# Patient Record
Sex: Male | Born: 1990
Health system: Southern US, Community
[De-identification: ages and names within clinical notes are randomized; demographics above are authoritative.]

## PROBLEM LIST (undated history)

## (undated) DIAGNOSIS — K219 Gastro-esophageal reflux disease without esophagitis: Secondary | ICD-10-CM

## (undated) DIAGNOSIS — G43909 Migraine, unspecified, not intractable, without status migrainosus: Secondary | ICD-10-CM

## (undated) DIAGNOSIS — I471 Supraventricular tachycardia, unspecified: Secondary | ICD-10-CM

## (undated) DIAGNOSIS — I10 Essential (primary) hypertension: Secondary | ICD-10-CM

## (undated) DIAGNOSIS — F419 Anxiety disorder, unspecified: Secondary | ICD-10-CM

## (undated) DIAGNOSIS — R000 Tachycardia, unspecified: Secondary | ICD-10-CM

## (undated) DIAGNOSIS — K449 Diaphragmatic hernia without obstruction or gangrene: Secondary | ICD-10-CM

## (undated) DIAGNOSIS — H43399 Other vitreous opacities, unspecified eye: Secondary | ICD-10-CM

## (undated) HISTORY — PX: WISDOM TOOTH EXTRACTION: SHX21

---
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
## 2012-11-20 ENCOUNTER — Encounter (HOSPITAL_COMMUNITY): Payer: Self-pay | Admitting: *Deleted

## 2012-11-20 ENCOUNTER — Emergency Department (HOSPITAL_COMMUNITY)
Admission: EM | Admit: 2012-11-20 | Discharge: 2012-11-20 | Disposition: A | Discharge status: Home / Self Care | Payer: Self-pay | Attending: Emergency Medicine | Admitting: Emergency Medicine

## 2012-11-20 DIAGNOSIS — Z8679 Personal history of other diseases of the circulatory system: Secondary | ICD-10-CM | POA: Insufficient documentation

## 2012-11-20 DIAGNOSIS — H60399 Other infective otitis externa, unspecified ear: Secondary | ICD-10-CM | POA: Insufficient documentation

## 2012-11-20 DIAGNOSIS — F17200 Nicotine dependence, unspecified, uncomplicated: Secondary | ICD-10-CM | POA: Insufficient documentation

## 2012-11-20 DIAGNOSIS — H6091 Unspecified otitis externa, right ear: Secondary | ICD-10-CM

## 2012-11-20 DIAGNOSIS — J3489 Other specified disorders of nose and nasal sinuses: Secondary | ICD-10-CM | POA: Insufficient documentation

## 2012-11-20 DIAGNOSIS — R197 Diarrhea, unspecified: Secondary | ICD-10-CM

## 2012-11-20 LAB — BASIC METABOLIC PANEL
BUN: 10 mg/dL (ref 6–23)
CO2: 27 mEq/L (ref 19–32)
Calcium: 10.2 mg/dL (ref 8.4–10.5)
Chloride: 102 mEq/L (ref 96–112)
Creatinine, Ser: 0.88 mg/dL (ref 0.50–1.35)
GFR calc Af Amer: >90 mL/min (ref >90)
GFR calc non Af Amer: >90 mL/min (ref >90)
Glucose, Bld: 91 mg/dL (ref 70–99)
Potassium: 4.4 mEq/L (ref 3.5–5.1)
Sodium: 140 mEq/L (ref 135–145)

## 2012-11-20 MED ORDER — DIPHENOXYLATE-ATROPINE 2.5-0.025 MG PO TABS
2.0000 | ORAL_TABLET | Freq: Once | ORAL | Status: AC
  Administered 2012-11-20: 2 via ORAL
  Filled 2012-11-20: qty 2

## 2012-11-20 MED ORDER — DIPHENOXYLATE-ATROPINE 2.5-0.025 MG PO TABS: 1.0000 | ORAL_TABLET | Freq: Four times a day (QID) | ORAL | Status: DC | PRN

## 2012-11-20 MED ORDER — ANTIPYRINE-BENZOCAINE 5.4-1.4 % OT SOLN
3.0000 [drp] | Freq: Once | OTIC | Status: AC
  Administered 2012-11-20: 3 [drp] via OTIC
  Filled 2012-11-20: qty 10

## 2012-11-20 MED ORDER — CIPROFLOXACIN-DEXAMETHASONE 0.3-0.1 % OT SUSP
3.0000 [drp] | Freq: Two times a day (BID) | OTIC | Status: DC
  Administered 2012-11-20: 3 [drp] via OTIC
  Filled 2012-11-20: qty 7.5

## 2012-11-20 MED ORDER — CIPROFLOXACIN-HYDROCORTISONE 0.2-1 % OT SUSP: 3.0000 [drp] | Freq: Two times a day (BID) | OTIC | Status: DC

## 2012-11-20 NOTE — ED Notes (Signed)
Nasal congestion,  Rt ear pain, diarrhea for 2 days,

## 2012-11-20 NOTE — ED Provider Notes (Signed)
History     CSN: [id number]  Arrival date & time 11/20/12  1120   First MD Initiated Contact with Patient 11/20/12 1139      Chief Complaint  Patient presents with  . Otalgia    (Consider location/radiation/quality/duration/timing/severity/associated sxs/prior treatment) HPI Comments: Samuel Blackwell is a 22 y.o. Male with 2 complaints today, the first being diarrhea which started 2 days ago.  He reports nonbloody brown watery stool preceded by intermittent abdominal cramping,  And had 7-8 episodes yesterday,  3 this am before arrival.  He denies abdominal pain,  Fever, nausea and vomiting.  He has had decreased PO intake as eating triggers the episodes.  No other household members have similar symptoms.  The second complaint is right ear ache which also started 2 days ago.  He has had mild nasal congestion without drainage.  He denies decreased hearing acuity or drainage from this ear, has had no headaches,  No dental concerns.  He has taken no medicines for his symptoms.       The history is provided by the patient.    Past Medical History  Diagnosis Date  . Migraines     History reviewed. No pertinent past surgical history.  History reviewed. No pertinent family history.  History  Substance Use Topics  . Smoking status: Current Every Day Smoker -- 0.25 packs/day    Types: Cigarettes  . Smokeless tobacco: Not on file  . Alcohol Use: No      Review of Systems  Constitutional: Negative for fever.  HENT: Positive for ear pain and congestion. Negative for hearing loss, sore throat, rhinorrhea, neck pain, tinnitus and ear discharge.   Eyes: Negative.   Respiratory: Negative for chest tightness and shortness of breath.   Cardiovascular: Negative for chest pain.  Gastrointestinal: Positive for diarrhea. Negative for nausea, vomiting and abdominal pain.  Genitourinary: Negative.   Musculoskeletal: Negative for joint swelling and arthralgias.  Skin: Negative.  Negative for  rash and wound.  Neurological: Negative for dizziness, weakness, light-headedness, numbness and headaches.  Psychiatric/Behavioral: Negative.     Allergies  Review of patient's allergies indicates no known allergies.  Home Medications   Current Outpatient Rx  Name  Route  Sig  Dispense  Refill  . diphenoxylate-atropine (LOMOTIL) 2.5-0.025 MG per tablet   Oral   Take 1 tablet by mouth 4 (four) times daily as needed for diarrhea or loose stools.   15 tablet   0     BP 123/82  Pulse 87  Temp(Src) 98.7 F (37.1 C)  Resp 20  Ht 5\' 8"  (1.727 m)  Wt 245 lb (111.131 kg)  BMI 37.26 kg/m2  SpO2 99%  Physical Exam  Nursing note and vitals reviewed. Constitutional: He appears well-developed and well-nourished.  HENT:  Head: Normocephalic and atraumatic.  Right Ear: Tympanic membrane normal. No drainage or swelling. Tympanic membrane is not injected.  Left Ear: External ear normal.  Right external canal has erythema without edema.  No drainage.  Pain with movement of external ear.  Eyes: Conjunctivae are normal.  Neck: Normal range of motion.  Cardiovascular: Normal rate, regular rhythm, normal heart sounds and intact distal pulses.   Pulmonary/Chest: Effort normal and breath sounds normal. He has no wheezes.  Abdominal: Soft. Bowel sounds are normal. There is no tenderness.  Musculoskeletal: Normal range of motion.  Neurological: He is alert.  Skin: Skin is warm and dry.  Psychiatric: He has a normal mood and affect.    ED Course  Procedures (including critical care time)  Labs Reviewed  BASIC METABOLIC PANEL   No results found.   1. External otitis of right ear   2. Diarrhea       MDM  Patients labs and/or radiological studies were reviewed during the medical decision making and disposition process. Pt was given dose of lomotil,  Prescription for more prn.  Also given ciprodex, auralgan.  Planned f/u with pcp if sx persist.        Burgess Amor,  PA 11/20/12 1413

## 2012-11-22 NOTE — ED Provider Notes (Signed)
Medical screening examination/treatment/procedure(s) were performed by non-physician practitioner and as supervising physician I was immediately available for consultation/collaboration.    Shaquile Lutze D Thao Bauza, MD 11/22/12 0328 

## 2012-12-19 ENCOUNTER — Emergency Department (HOSPITAL_COMMUNITY)
Admission: EM | Admit: 2012-12-19 | Discharge: 2012-12-19 | Disposition: A | Discharge status: Home / Self Care | Payer: Self-pay | Attending: Emergency Medicine | Admitting: Emergency Medicine

## 2012-12-19 ENCOUNTER — Encounter (HOSPITAL_COMMUNITY): Payer: Self-pay | Admitting: *Deleted

## 2012-12-19 DIAGNOSIS — F17200 Nicotine dependence, unspecified, uncomplicated: Secondary | ICD-10-CM | POA: Insufficient documentation

## 2012-12-19 DIAGNOSIS — R112 Nausea with vomiting, unspecified: Secondary | ICD-10-CM | POA: Insufficient documentation

## 2012-12-19 DIAGNOSIS — G43909 Migraine, unspecified, not intractable, without status migrainosus: Secondary | ICD-10-CM | POA: Insufficient documentation

## 2012-12-19 MED ORDER — DEXAMETHASONE 6 MG PO TABS: ORAL_TABLET | ORAL | Status: DC

## 2012-12-19 MED ORDER — HYDROCODONE-ACETAMINOPHEN 7.5-325 MG PO TABS
1.0000 | ORAL_TABLET | ORAL | Status: DC | PRN
Start: 2012-12-19 — End: 2014-09-29

## 2012-12-19 MED ORDER — PROMETHAZINE HCL 25 MG PO TABS: 25.0000 mg | ORAL_TABLET | Freq: Four times a day (QID) | ORAL | Status: DC | PRN

## 2012-12-19 NOTE — ED Provider Notes (Signed)
History     CSN: [id number]  Arrival date & time 12/19/12  1306   First MD Initiated Contact with Patient 12/19/12 1352      Chief Complaint  Patient presents with  . Migraine    (Consider location/radiation/quality/duration/timing/severity/associated sxs/prior treatment) HPI Comments: Pt c/o frontal headache since 5AM. 1 episode of vomiting this AM, but none now.  Patient is a 22 y.o. male presenting with migraines. The history is provided by the patient.  Migraine This is a recurrent problem. The current episode started today. The problem occurs constantly. The problem has been unchanged. Associated symptoms include headaches and nausea. Pertinent negatives include no abdominal pain, arthralgias, chest pain, coughing, fever, neck pain or sore throat. Exacerbated by: lights and noises. He has tried NSAIDs for the symptoms. The treatment provided no relief.    Past Medical History  Diagnosis Date  . Migraines     History reviewed. No pertinent past surgical history.  No family history on file.  History  Substance Use Topics  . Smoking status: Current Every Day Smoker -- 0.25 packs/day    Types: Cigarettes  . Smokeless tobacco: Not on file  . Alcohol Use: No      Review of Systems  Constitutional: Negative for fever and activity change.       All ROS Neg except as noted in HPI  HENT: Negative for nosebleeds, sore throat and neck pain.   Eyes: Negative for photophobia and discharge.  Respiratory: Negative for cough, shortness of breath and wheezing.   Cardiovascular: Negative for chest pain and palpitations.  Gastrointestinal: Positive for nausea. Negative for abdominal pain and blood in stool.  Genitourinary: Negative for dysuria, frequency and hematuria.  Musculoskeletal: Negative for back pain and arthralgias.  Skin: Negative.   Neurological: Positive for headaches. Negative for dizziness, seizures and speech difficulty.  Psychiatric/Behavioral: Negative for  hallucinations and confusion.    Allergies  Review of patient's allergies indicates no known allergies.  Home Medications   Current Outpatient Rx  Name  Route  Sig  Dispense  Refill  . diphenoxylate-atropine (LOMOTIL) 2.5-0.025 MG per tablet   Oral   Take 1 tablet by mouth 4 (four) times daily as needed for diarrhea or loose stools.   15 tablet   0     BP 147/74  Pulse 64  Temp(Src) 98.4 F (36.9 C) (Oral)  Resp 20  SpO2 99%  Physical Exam  Nursing note and vitals reviewed. Constitutional: He is oriented to person, place, and time. He appears well-developed and well-nourished.  Non-toxic appearance.  HENT:  Head: Normocephalic.  Right Ear: Tympanic membrane and external ear normal.  Left Ear: Tympanic membrane and external ear normal.  Eyes: EOM and lids are normal. Pupils are equal, round, and reactive to light.  Neck: Normal range of motion. Neck supple. Carotid bruit is not present.  Cardiovascular: Normal rate, regular rhythm, normal heart sounds, intact distal pulses and normal pulses.   Pulmonary/Chest: Breath sounds normal. No respiratory distress.  Abdominal: Soft. Bowel sounds are normal. There is no tenderness. There is no guarding.  Musculoskeletal: Normal range of motion.  Lymphadenopathy:       Head (right side): No submandibular adenopathy present.       Head (left side): No submandibular adenopathy present.    He has no cervical adenopathy.  Neurological: He is alert and oriented to person, place, and time. He has normal strength. No cranial nerve deficit or sensory deficit. He exhibits normal muscle tone. Coordination normal.  Skin: Skin is warm and dry.  Psychiatric: He has a normal mood and affect. His speech is normal.    ED Course  Procedures (including critical care time)  Labs Reviewed - No data to display No results found.   No diagnosis found.    MDM  I have reviewed nursing notes, vital signs, and all appropriate lab and imaging  results for this patient. Patient has a history of migraine headaches. This headache is similar to previous headaches. No gross neurologic deficits appreciated on examination. Patient will be treated with Decadron 6 mg daily, Norco 7.5 mg every 4 hours, and promethazine 25 mg every 6 hours. Vision is advised to return to the emergency department immediately if any changes, problems, or concerns.       Kathie Dike, PA-C 12/20/12 [phone]

## 2012-12-19 NOTE — ED Notes (Addendum)
Pt c/o migraine headache that started this am, did experience one episode of n/v, denies any nausea feeling at present, is bothered by light and sounds,. Has hx of migraines since he was 22 years old. Pt states that this migraine is the same as migraines in the past.

## 2012-12-20 NOTE — ED Provider Notes (Signed)
Medical screening examination/treatment/procedure(s) were performed by non-physician practitioner and as supervising physician I was immediately available for consultation/collaboration.  Donnetta Hutching, MD 12/20/12 [phone]

## 2013-02-20 ENCOUNTER — Encounter (HOSPITAL_COMMUNITY): Payer: Self-pay | Admitting: *Deleted

## 2013-02-20 ENCOUNTER — Emergency Department (HOSPITAL_COMMUNITY)
Admission: EM | Admit: 2013-02-20 | Discharge: 2013-02-20 | Disposition: A | Discharge status: Home / Self Care | Payer: Self-pay | Attending: Emergency Medicine | Admitting: Emergency Medicine

## 2013-02-20 DIAGNOSIS — R21 Rash and other nonspecific skin eruption: Secondary | ICD-10-CM | POA: Insufficient documentation

## 2013-02-20 DIAGNOSIS — K089 Disorder of teeth and supporting structures, unspecified: Secondary | ICD-10-CM | POA: Insufficient documentation

## 2013-02-20 DIAGNOSIS — G8929 Other chronic pain: Secondary | ICD-10-CM | POA: Insufficient documentation

## 2013-02-20 DIAGNOSIS — L089 Local infection of the skin and subcutaneous tissue, unspecified: Secondary | ICD-10-CM | POA: Insufficient documentation

## 2013-02-20 DIAGNOSIS — G43909 Migraine, unspecified, not intractable, without status migrainosus: Secondary | ICD-10-CM | POA: Insufficient documentation

## 2013-02-20 DIAGNOSIS — F17200 Nicotine dependence, unspecified, uncomplicated: Secondary | ICD-10-CM | POA: Insufficient documentation

## 2013-02-20 DIAGNOSIS — R509 Fever, unspecified: Secondary | ICD-10-CM | POA: Insufficient documentation

## 2013-02-20 MED ORDER — CIPROFLOXACIN HCL 500 MG PO TABS: 500.0000 mg | ORAL_TABLET | Freq: Two times a day (BID) | ORAL | Status: DC

## 2013-02-20 MED ORDER — HYDROCODONE-ACETAMINOPHEN 5-325 MG PO TABS: 1.0000 | ORAL_TABLET | ORAL | Status: DC | PRN

## 2013-02-20 MED ORDER — SULFAMETHOXAZOLE-TRIMETHOPRIM 800-160 MG PO TABS: 1.0000 | ORAL_TABLET | Freq: Two times a day (BID) | ORAL | Status: DC

## 2013-02-20 NOTE — ED Notes (Signed)
Pt c/o right side dental pain for the past two years, pain became worse since march, c/o pain to bilateral axilla area that started two days ago,

## 2013-02-20 NOTE — ED Notes (Signed)
Pt c/o left side dental pain. Pt also presents with red abscess-like areas under both arms. Pt states he first noticed areas 2 days ago.

## 2013-02-20 NOTE — ED Provider Notes (Signed)
Medical screening examination/treatment/procedure(s) were performed by non-physician practitioner and as supervising physician I was immediately available for consultation/collaboration.   Linell Meldrum, MD 02/20/13 2342 

## 2013-02-20 NOTE — ED Provider Notes (Signed)
History     CSN: [id number]  Arrival date & time 02/20/13  1610   First MD Initiated Contact with Patient 02/20/13 1629      Chief Complaint  Patient presents with  . Arm Pain  . Dental Pain    (Consider location/radiation/quality/duration/timing/severity/associated sxs/prior treatment) Patient is a 22 y.o. male presenting with tooth pain and rash. The history is provided by the patient.  Dental Pain Location:  Lower Lower teeth location:  32/RL 3rd molar Quality:  Throbbing Severity:  Moderate Onset quality:  Gradual Duration:  2 months Timing:  Intermittent Progression:  Worsening Chronicity:  Recurrent Relieved by:  Nothing Worsened by:  Pressure Ineffective treatments:  None tried Associated symptoms: no neck pain  Fever: low grade.   Rash Pain location: bilateral axilla. Pain quality: burning and sharp   Pain radiates to:  Does not radiate Pain severity:  Moderate Onset quality:  Gradual Duration:  2 days Timing:  Constant Progression:  Worsening Chronicity:  New Context comment:  Hot tub Relieved by:  Nothing Worsened by:  Palpation Ineffective treatments:  None tried Associated symptoms: no anorexia, no cough, no diarrhea, no nausea, no shortness of breath, no sore throat and no vomiting  Fever: low grade.    Samuel Blackwell is a 22 y.o. male who presents to the ED with dental pain and rash. The dental pain has been off and on for two years and this episode got worse over the past 2 months. The pain is located in the right lower wisdom tooth that is trying to come through. The rash is located under both arms and started 2 days ago after being in a hot tub. The areas are painful, red swollen.   Past Medical History  Diagnosis Date  . Migraines     History reviewed. No pertinent past surgical history.  No family history on file.  History  Substance Use Topics  . Smoking status: Current Every Day Smoker -- 0.25 packs/day    Types: Cigarettes  .  Smokeless tobacco: Not on file  . Alcohol Use: No      Review of Systems  Constitutional: Negative for activity change. Fever: low grade.  HENT: Positive for dental problem. Negative for ear pain, sore throat and neck pain.   Eyes: Negative for pain.  Respiratory: Negative for cough and shortness of breath.   Gastrointestinal: Negative for nausea, vomiting, abdominal pain, diarrhea and anorexia.  Musculoskeletal:       Rash under both arms and a few areas on upper back.  Skin: Positive for rash.  Allergic/Immunologic: Negative for immunocompromised state.  Neurological: Negative for dizziness and syncope.  Psychiatric/Behavioral: The patient is not nervous/anxious.     Allergies  Review of patient's allergies indicates no known allergies.  Home Medications   Current Outpatient Rx  Name  Route  Sig  Dispense  Refill  . dexamethasone (DECADRON) 6 MG tablet      1 po daily   6 tablet   0   . HYDROcodone-acetaminophen (NORCO) 7.5-325 MG per tablet   Oral   Take 1 tablet by mouth every 4 (four) hours as needed for pain.   15 tablet   0   . EXPIRED: promethazine (PHENERGAN) 25 MG tablet   Oral   Take 1 tablet (25 mg total) by mouth every 6 (six) hours as needed for nausea.   6 tablet   0   . promethazine (PHENERGAN) 25 MG tablet   Oral   Take 1  tablet (25 mg total) by mouth every 6 (six) hours as needed for nausea.   12 tablet   0     BP 155/96  Pulse 88  Temp(Src) 101.2 F (38.4 C) (Oral)  Resp 20  Ht 5\' 8"  (1.727 m)  Wt 245 lb (111.131 kg)  BMI 37.26 kg/m2  SpO2 100%  Temp rechecked at 16:57 and 98.1, he has not had anything cold to drink and has not had any treatment for fever. His initial temperature was taking when he came in from outside after smoking.   Physical Exam  Nursing note and vitals reviewed. Constitutional: He is oriented to person, place, and time. He appears well-developed and well-nourished. No distress.  HENT:  Head: Normocephalic.   Right Ear: Tympanic membrane normal.  Left Ear: Tympanic membrane normal.  Nose: Nose normal.  Mouth/Throat: Uvula is midline, oropharynx is clear and moist and mucous membranes are normal.  Right third molar erupting and tender with palpation.  Eyes: EOM are normal.  Neck: Normal range of motion. Neck supple.  Cardiovascular: Normal rate.   Pulmonary/Chest: Effort normal and breath sounds normal.  Musculoskeletal: Normal range of motion. He exhibits no edema.  Neurological: He is alert and oriented to person, place, and time. No cranial nerve deficit.  Skin: Skin is warm and dry. Rash noted. Rash is papular.  Lesions noted bilateral axilla with erythema and some areas with small pustular areas. Few areas noted on upper back as well.   Psychiatric: He has a normal mood and affect. His behavior is normal.    ED Course  Procedures (including critical care time)  MDM  22 y.o. male with dental pain due to eruption of right lower third molar. Rash bilateral axilla and upper back with infection.  Since patient has history of being in a hot tub prior to the skin infection I will treat him with Cipro in the event the infection my be caused by Pseudomonas. I will also treat with Bactrim DS for possible staph. I discussed with the patent the importance of follow up and becoming established with a PCP for his general health. Patient voices understanding. Patient stable for discharge home without any immediate complications.  Discussed findings and plan of care with the patient .All questioned fully answered. He will return if any problems arise.     Medication List    TAKE these medications       ciprofloxacin 500 MG tablet  Commonly known as:  CIPRO  Take 1 tablet (500 mg total) by mouth 2 (two) times daily.     HYDROcodone-acetaminophen 5-325 MG per tablet  Commonly known as:  NORCO/VICODIN  Take 1 tablet by mouth every 4 (four) hours as needed.     sulfamethoxazole-trimethoprim 800-160  MG per tablet  Commonly known as:  SEPTRA DS  Take 1 tablet by mouth every 12 (twelve) hours.      ASK your doctor about these medications       dexamethasone 6 MG tablet  Commonly known as:  DECADRON  1 po daily     HYDROcodone-acetaminophen 7.5-325 MG per tablet  Commonly known as:  NORCO  Take 1 tablet by mouth every 4 (four) hours as needed for pain.     promethazine 25 MG tablet  Commonly known as:  PHENERGAN  Take 1 tablet (25 mg total) by mouth every 6 (six) hours as needed for nausea.     promethazine 25 MG tablet  Commonly known as:  PHENERGAN  Take  1 tablet (25 mg total) by mouth every 6 (six) hours as needed for nausea.              [facility] Orlene Och, [place] 02/20/13 [phone]

## 2013-11-27 ENCOUNTER — Emergency Department (HOSPITAL_COMMUNITY): Payer: Self-pay

## 2013-11-27 ENCOUNTER — Emergency Department (HOSPITAL_COMMUNITY)
Admission: EM | Admit: 2013-11-27 | Discharge: 2013-11-27 | Disposition: A | Discharge status: Home / Self Care | Payer: Self-pay | Attending: Emergency Medicine | Admitting: Emergency Medicine

## 2013-11-27 ENCOUNTER — Encounter (HOSPITAL_COMMUNITY): Payer: Self-pay | Admitting: Emergency Medicine

## 2013-11-27 DIAGNOSIS — N200 Calculus of kidney: Secondary | ICD-10-CM | POA: Insufficient documentation

## 2013-11-27 DIAGNOSIS — Z8679 Personal history of other diseases of the circulatory system: Secondary | ICD-10-CM | POA: Insufficient documentation

## 2013-11-27 DIAGNOSIS — Z87891 Personal history of nicotine dependence: Secondary | ICD-10-CM | POA: Insufficient documentation

## 2013-11-27 LAB — URINALYSIS, ROUTINE W REFLEX MICROSCOPIC
Bilirubin Urine: NEGATIVE
GLUCOSE, UA: NEGATIVE mg/dL
KETONES UR: NEGATIVE mg/dL
LEUKOCYTES UA: NEGATIVE
Nitrite: NEGATIVE
PH: 5.5 (ref 5.0–8.0)
Urobilinogen, UA: 0.2 mg/dL (ref 0.0–1.0)

## 2013-11-27 LAB — BASIC METABOLIC PANEL
BUN: 13 mg/dL (ref 6–23)
CHLORIDE: 101 meq/L (ref 96–112)
CO2: 25 meq/L (ref 19–32)
CREATININE: 1.11 mg/dL (ref 0.50–1.35)
Calcium: 9.3 mg/dL (ref 8.4–10.5)
GFR calc non Af Amer: >90 mL/min (ref >90)
Glucose, Bld: 117 mg/dL — ABNORMAL HIGH (ref 70–99)
POTASSIUM: 4.1 meq/L (ref 3.7–5.3)
Sodium: 139 mEq/L (ref 137–147)

## 2013-11-27 LAB — URINE MICROSCOPIC-ADD ON

## 2013-11-27 MED ORDER — HYDROMORPHONE HCL PF 1 MG/ML IJ SOLN
1.0000 mg | Freq: Once | INTRAMUSCULAR | Status: AC
  Administered 2013-11-27: 1 mg via INTRAVENOUS
  Filled 2013-11-27: qty 1

## 2013-11-27 MED ORDER — PROMETHAZINE HCL 25 MG PO TABS: 25.0000 mg | ORAL_TABLET | Freq: Four times a day (QID) | ORAL | Status: DC | PRN

## 2013-11-27 MED ORDER — OXYCODONE-ACETAMINOPHEN 5-325 MG PO TABS
2.0000 | ORAL_TABLET | Freq: Once | ORAL | Status: AC
  Administered 2013-11-27: 2 via ORAL
  Filled 2013-11-27: qty 2

## 2013-11-27 MED ORDER — HYDROCODONE-ACETAMINOPHEN 5-325 MG PO TABS: 2.0000 | ORAL_TABLET | ORAL | Status: DC | PRN

## 2013-11-27 MED ORDER — ONDANSETRON HCL 4 MG/2ML IJ SOLN
4.0000 mg | Freq: Once | INTRAMUSCULAR | Status: AC
  Administered 2013-11-27: 4 mg via INTRAVENOUS
  Filled 2013-11-27: qty 2

## 2013-11-27 MED ORDER — KETOROLAC TROMETHAMINE 30 MG/ML IJ SOLN
30.0000 mg | Freq: Once | INTRAMUSCULAR | Status: AC
  Administered 2013-11-27: 30 mg via INTRAVENOUS
  Filled 2013-11-27: qty 1

## 2013-11-27 MED ORDER — NAPROXEN 500 MG PO TABS: 500.0000 mg | ORAL_TABLET | Freq: Two times a day (BID) | ORAL | Status: DC

## 2013-11-27 NOTE — ED Provider Notes (Signed)
CSN: [id number]     Arrival date & time 11/27/13  0123 History   First MD Initiated Contact with Patient 11/27/13 0149     Chief Complaint  Patient presents with  . Flank Pain     (Consider location/radiation/quality/duration/timing/severity/associated sxs/prior Treatment) HPI Comments: 23 year old male, presents with a complaint of abdominal pain which is located in the right side and mid abdomen, started when he awoke at 9:00 in the morning yesterday, has been intermittent throughout the last 18 hours but became severe this evening causing associated vomiting. He denies any change in the color of his urine, this pain does not radiate to his flank, it does not radiate to his groin and he has no testicular pain or masses. He denies anything that makes this better or worse, he states that it is spontaneous and there is no position of comfort. He has no history of kidney stones and has never had abdominal surgery.  Patient is a 23 y.o. male presenting with flank pain. The history is provided by the patient.  Flank Pain    Past Medical History  Diagnosis Date  . Migraines    History reviewed. No pertinent past surgical history. No family history on file. History  Substance Use Topics  . Smoking status: Former Smoker -- 0.25 packs/day    Types: Cigarettes  . Smokeless tobacco: Not on file  . Alcohol Use: Yes    Review of Systems  Genitourinary: Positive for flank pain.  All other systems reviewed and are negative.      Allergies  Review of patient's allergies indicates no known allergies.  Home Medications   Current Outpatient Rx  Name  Route  Sig  Dispense  Refill  . ciprofloxacin (CIPRO) 500 MG tablet   Oral   Take 1 tablet (500 mg total) by mouth 2 (two) times daily.   14 tablet   0   . dexamethasone (DECADRON) 6 MG tablet      1 po daily   6 tablet   0   . HYDROcodone-acetaminophen (NORCO) 7.5-325 MG per tablet   Oral   Take 1 tablet by mouth every 4 (four)  hours as needed for pain.   15 tablet   0   . HYDROcodone-acetaminophen (NORCO/VICODIN) 5-325 MG per tablet   Oral   Take 1 tablet by mouth every 4 (four) hours as needed.   15 tablet   0   . HYDROcodone-acetaminophen (NORCO/VICODIN) 5-325 MG per tablet   Oral   Take 2 tablets by mouth every 4 (four) hours as needed.   10 tablet   0   . naproxen (NAPROSYN) 500 MG tablet   Oral   Take 1 tablet (500 mg total) by mouth 2 (two) times daily with a meal.   30 tablet   0   . EXPIRED: promethazine (PHENERGAN) 25 MG tablet   Oral   Take 1 tablet (25 mg total) by mouth every 6 (six) hours as needed for nausea.   6 tablet   0   . promethazine (PHENERGAN) 25 MG tablet   Oral   Take 1 tablet (25 mg total) by mouth every 6 (six) hours as needed for nausea.   12 tablet   0   . promethazine (PHENERGAN) 25 MG tablet   Oral   Take 1 tablet (25 mg total) by mouth every 6 (six) hours as needed for nausea or vomiting.   12 tablet   0   . sulfamethoxazole-trimethoprim (SEPTRA DS) 800-160 MG  per tablet   Oral   Take 1 tablet by mouth every 12 (twelve) hours.   14 tablet   0    BP 147/77  Pulse 108  Temp(Src) 98.1 F (36.7 C) (Oral)  Resp 22  Ht 5\' 9"  (1.753 m)  Wt 240 lb (108.863 kg)  BMI 35.43 kg/m2  SpO2 96% Physical Exam  Nursing note and vitals reviewed. Constitutional: He appears well-developed and well-nourished. He appears distressed.  HENT:  Head: Normocephalic and atraumatic.  Mouth/Throat: Oropharynx is clear and moist. No oropharyngeal exudate.  Eyes: Conjunctivae and EOM are normal. Pupils are equal, round, and reactive to light. Right eye exhibits no discharge. Left eye exhibits no discharge. No scleral icterus.  Neck: Normal range of motion. Neck supple. No JVD present. No thyromegaly present.  Cardiovascular: Normal rate, regular rhythm, normal heart sounds and intact distal pulses.  Exam reveals no gallop and no friction rub.   No murmur  heard. Pulmonary/Chest: Effort normal and breath sounds normal. No respiratory distress. He has no wheezes. He has no rales.  Abdominal: Soft. Bowel sounds are normal. He exhibits no distension and no mass. There is no tenderness.  Soft nontender abdomen with no CVA tenderness  Genitourinary:  Normal appearing testicles, scrotum, penis and no inguinal hernias present  Musculoskeletal: Normal range of motion. He exhibits no edema and no tenderness.  Lymphadenopathy:    He has no cervical adenopathy.  Neurological: He is alert. Coordination normal.  Skin: Skin is warm and dry. No rash noted. No erythema.  Psychiatric: He has a normal mood and affect. His behavior is normal.    ED Course  Procedures (including critical care time) Labs Review Labs Reviewed  URINALYSIS, ROUTINE W REFLEX MICROSCOPIC - Abnormal; Notable for the following:    Specific Gravity, Urine >1.030 (*)    Hgb urine dipstick LARGE (*)    Protein, ur TRACE (*)    All other components within normal limits  BASIC METABOLIC PANEL - Abnormal; Notable for the following:    Glucose, Bld 117 (*)    All other components within normal limits  URINE MICROSCOPIC-ADD ON - Abnormal; Notable for the following:    Bacteria, UA FEW (*)    Crystals CA OXALATE CRYSTALS (*)    All other components within normal limits   Imaging Review Ct Abdomen Pelvis Wo Contrast  11/27/2013   CLINICAL DATA:  Right flank pain.  EXAM: CT ABDOMEN AND PELVIS WITHOUT CONTRAST  TECHNIQUE: Multidetector CT imaging of the abdomen and pelvis was performed following the standard protocol without intravenous contrast.  COMPARISON:  None.  FINDINGS: BODY WALL: Unremarkable.  LOWER CHEST: Unremarkable.  ABDOMEN/PELVIS:  Liver: No focal abnormality.  Biliary: No evidence of biliary obstruction or stone.  Pancreas: Unremarkable.  Spleen: Unremarkable.  Adrenals: Unremarkable.  Kidneys and ureters: There is mild right hydroureteronephrosis. Mild right perinephric and  periureteric edema. No stone is seen within the ureter. No definite renal stone. Occasionally, ascending urinary infection can give a similar appearance, but the urinalysis shows no signs of infection.  Bladder: Decompressed.  No stones visible.  Reproductive: Unremarkable.  Bowel: No obstruction. Normal appendix.  Retroperitoneum: Enlarged ileocolic chain nodes, usually remote/reactive given the clinical presentation and the lack of intra-abdominal adenopathy or splenomegaly.  Peritoneum: No free fluid or gas.  Vascular: No acute abnormality.  OSSEOUS: No acute abnormalities.  IMPRESSION: Mild right hydronephrosis and periureteric edema. No visible urolithiasis, favor recently passed stone.   Electronically Signed   By: Roderic Palau  Watts M.D.   On: 11/27/2013 04:36     EKG Interpretation None      MDM   Final diagnoses:  Kidney stone on right side   The patient is writhing in pain, he has an exam and history consistent with kidney stone, will give medications and fluids and reevaluate. Bedside ultrasound pending  Emergency Focused Ultrasound Exam Limited retroperitoneal ultrasound of kidneys  Performed and interpreted by Dr. Reather Converse Indication: flank pain Focused abdominal ultrasound with Right kidney imaged in transverse and longitudinal planes in real-time. Interpretation: mild hydronephrosis visualized.  No stones or cysts visualized  Images archived electronically  Improved after meds - CT confirms some hydro - no stone seen, pt informed  Meds given in ED:  Medications  ketorolac (TORADOL) 30 MG/ML injection 30 mg (30 mg Intravenous Given 11/27/13 0204)  HYDROmorphone (DILAUDID) injection 1 mg (1 mg Intravenous Given 11/27/13 0204)  ondansetron (ZOFRAN) injection 4 mg (4 mg Intravenous Given 11/27/13 0204)  oxyCODONE-acetaminophen (PERCOCET/ROXICET) 5-325 MG per tablet 2 tablet (2 tablets Oral Given 11/27/13 0439)    New Prescriptions   HYDROCODONE-ACETAMINOPHEN (NORCO/VICODIN) 5-325 MG  PER TABLET    Take 2 tablets by mouth every 4 (four) hours as needed.   NAPROXEN (NAPROSYN) 500 MG TABLET    Take 1 tablet (500 mg total) by mouth 2 (two) times daily with a meal.   PROMETHAZINE (PHENERGAN) 25 MG TABLET    Take 1 tablet (25 mg total) by mouth every 6 (six) hours as needed for nausea or vomiting.       Johnna Acosta, MD 11/27/13 [phone]

## 2013-11-27 NOTE — ED Notes (Signed)
Patient c/o right flank pain since yesterday morning; patient states has vomited several times.

## 2013-11-27 NOTE — Discharge Instructions (Signed)
Please call your doctor for a followup appointment within 24-48 hours. When you talk to your doctor please let them know that you were seen in the emergency department and have them acquire all of your records so that they can discuss the findings with you and formulate a treatment plan to fully care for your new and ongoing problems. ° °

## 2013-11-27 NOTE — ED Notes (Signed)
Patient with no complaints at this time. Respirations even and unlabored. Skin warm/dry. Discharge instructions reviewed with patient at this time. Patient given opportunity to voice concerns/ask questions. Patient discharged at this time and left Emergency Department with steady gait.   

## 2014-09-19 ENCOUNTER — Encounter (HOSPITAL_COMMUNITY): Payer: Self-pay | Admitting: Emergency Medicine

## 2014-09-19 ENCOUNTER — Emergency Department (HOSPITAL_COMMUNITY)
Admission: EM | Admit: 2014-09-19 | Discharge: 2014-09-19 | Disposition: A | Discharge status: Home / Self Care | Payer: Self-pay | Attending: Emergency Medicine | Admitting: Emergency Medicine

## 2014-09-19 DIAGNOSIS — Z87891 Personal history of nicotine dependence: Secondary | ICD-10-CM | POA: Insufficient documentation

## 2014-09-19 DIAGNOSIS — F419 Anxiety disorder, unspecified: Secondary | ICD-10-CM | POA: Insufficient documentation

## 2014-09-19 DIAGNOSIS — I471 Supraventricular tachycardia: Secondary | ICD-10-CM | POA: Insufficient documentation

## 2014-09-19 LAB — BASIC METABOLIC PANEL
Anion gap: 10 (ref 5–15)
BUN: 14 mg/dL (ref 6–23)
CO2: 23 mmol/L (ref 19–32)
Calcium: 10.2 mg/dL (ref 8.4–10.5)
Chloride: 109 mEq/L (ref 96–112)
Creatinine, Ser: 0.93 mg/dL (ref 0.50–1.35)
GFR calc Af Amer: >90 mL/min (ref >90)
GFR calc non Af Amer: >90 mL/min (ref >90)
Glucose, Bld: 120 mg/dL — ABNORMAL HIGH (ref 70–99)
Potassium: 3.4 mmol/L — ABNORMAL LOW (ref 3.5–5.1)
Sodium: 142 mmol/L (ref 135–145)

## 2014-09-19 LAB — I-STAT TROPONIN, ED: Troponin i, poc: 0 ng/mL (ref 0.00–0.08)

## 2014-09-19 LAB — TSH: TSH: 2.556 u[IU]/mL (ref 0.350–4.500)

## 2014-09-19 LAB — CBC
HCT: 48.8 % (ref 39.0–52.0)
Hemoglobin: 17.2 g/dL — ABNORMAL HIGH (ref 13.0–17.0)
MCH: 29.7 pg (ref 26.0–34.0)
MCHC: 35.2 g/dL (ref 30.0–36.0)
MCV: 84.1 fL (ref 78.0–100.0)
Platelets: 368 10*3/uL (ref 150–400)
RBC: 5.8 MIL/uL (ref 4.22–5.81)
RDW: 12.6 % (ref 11.5–15.5)
WBC: 16.2 10*3/uL — ABNORMAL HIGH (ref 4.0–10.5)

## 2014-09-19 LAB — MAGNESIUM: Magnesium: 1.8 mg/dL (ref 1.5–2.5)

## 2014-09-19 MED ORDER — ADENOSINE 6 MG/2ML IV SOLN
12.0000 mg | Freq: Once | INTRAVENOUS | Status: AC
  Administered 2014-09-19: 12 mg via INTRAVENOUS

## 2014-09-19 MED ORDER — ADENOSINE 6 MG/2ML IV SOLN
INTRAVENOUS | Status: AC
  Administered 2014-09-19: 6 mg
  Filled 2014-09-19: qty 6

## 2014-09-19 MED ORDER — SODIUM CHLORIDE 0.9 % IV SOLN
INTRAVENOUS | Status: DC
  Administered 2014-09-19: 16:00:00 via INTRAVENOUS

## 2014-09-19 MED ORDER — POTASSIUM CHLORIDE CRYS ER 20 MEQ PO TBCR
40.0000 meq | EXTENDED_RELEASE_TABLET | Freq: Once | ORAL | Status: AC
  Administered 2014-09-19: 40 meq via ORAL
  Filled 2014-09-19: qty 2

## 2014-09-19 NOTE — ED Provider Notes (Signed)
CSN: [id number]     Arrival date & time 09/19/14  1513 History   First MD Initiated Contact with Patient 09/19/14 1534     Chief Complaint  Patient presents with  . Tachycardia     (Consider location/radiation/quality/duration/timing/severity/associated sxs/prior Treatment) HPI   23 year old male with palpitations. Symptom onset was before arrival. Patient reports very rapid heart rate. Denies any pain anywhere. No shortness of breath. No dizziness or lightheadedness. He was feeling fine just prior to onset.  No past history of similar symptoms.  Drinks soft drinks throughout the day but has for quite some time. Denies any energy drink usage. No drug use. Smoker. No significant past medical history aside from migraines.   Past Medical History  Diagnosis Date  . Migraines    History reviewed. No pertinent past surgical history. History reviewed. No pertinent family history. History  Substance Use Topics  . Smoking status: Former Smoker -- 0.25 packs/day    Types: Cigarettes  . Smokeless tobacco: Not on file  . Alcohol Use: Yes    Review of Systems  All systems reviewed and negative, other than as noted in HPI.   Allergies  Review of patient's allergies indicates no known allergies.  Home Medications   Prior to Admission medications   Medication Sig Start Date End Date Taking? Authorizing Provider  ibuprofen (ADVIL,MOTRIN) 200 MG tablet Take 200 mg by mouth every 6 (six) hours as needed for mild pain or moderate pain.   Yes Historical Provider, MD  ciprofloxacin (CIPRO) 500 MG tablet Take 1 tablet (500 mg total) by mouth 2 (two) times daily. Patient not taking: Reported on 09/19/2014 02/20/13   Ashley Murrain, NP  dexamethasone (DECADRON) 6 MG tablet 1 po daily Patient not taking: Reported on 09/19/2014 12/19/12   Lenox Ahr, PA-C  HYDROcodone-acetaminophen [facility]) 7.5-325 MG per tablet Take 1 tablet by mouth every 4 (four) hours as needed for pain. Patient not taking:  Reported on 09/19/2014 12/19/12   Lenox Ahr, PA-C  HYDROcodone-acetaminophen (NORCO/VICODIN) 5-325 MG per tablet Take 1 tablet by mouth every 4 (four) hours as needed. Patient not taking: Reported on 09/19/2014 02/20/13   Ashley Murrain, NP  HYDROcodone-acetaminophen (NORCO/VICODIN) 5-325 MG per tablet Take 2 tablets by mouth every 4 (four) hours as needed. Patient not taking: Reported on 09/19/2014 11/27/13   Johnna Acosta, MD  naproxen (NAPROSYN) 500 MG tablet Take 1 tablet (500 mg total) by mouth 2 (two) times daily with a meal. Patient not taking: Reported on 09/19/2014 11/27/13   Johnna Acosta, MD  promethazine (PHENERGAN) 25 MG tablet Take 1 tablet (25 mg total) by mouth every 6 (six) hours as needed for nausea. 11/08/11 11/15/11  Janice Norrie, MD  promethazine (PHENERGAN) 25 MG tablet Take 1 tablet (25 mg total) by mouth every 6 (six) hours as needed for nausea. Patient not taking: Reported on 09/19/2014 12/19/12   Lenox Ahr, PA-C  sulfamethoxazole-trimethoprim (SEPTRA DS) 800-160 MG per tablet Take 1 tablet by mouth every 12 (twelve) hours. Patient not taking: Reported on 09/19/2014 02/20/13   Ashley Murrain, NP   BP 126/93 mmHg  Pulse 181  Resp 26  SpO2 100% Physical Exam  Constitutional: He appears well-developed and well-nourished.  HENT:  Head: Normocephalic and atraumatic.  Eyes: Conjunctivae are normal. Right eye exhibits no discharge. Left eye exhibits no discharge.  Neck: Neck supple.  Cardiovascular: Regular rhythm and normal heart sounds.  Exam reveals no gallop and no friction rub.  No murmur heard. Markedly tachycardic  Pulmonary/Chest: Effort normal and breath sounds normal. No respiratory distress.  Abdominal: Soft. He exhibits no distension. There is no tenderness.  Musculoskeletal: He exhibits no edema or tenderness.  Neurological: He is alert.  Skin: Skin is warm and dry. He is not diaphoretic.  Psychiatric: His behavior is normal. Thought content normal.   Somewhat anxious  Nursing note and vitals reviewed.   ED Course  CARDI[facility]ERSION - Pharmaceutical Date/Time: 09/19/2014 3:30 PM Performed by: Virgel Manifold Authorized by: Virgel Manifold Consent: Verbal consent obtained. Patient identity confirmed: verbally with patient and provided demographic data Patient sedated: no Cardioversion basis: emergent Pre-procedure rhythm: supraventricular tachycardia Patient position: patient was placed in a supine position Number of attempts: 2 (adenosine 6 mg x1, 12 mg x1) Post-procedure rhythm: sinus tachcyardia. Complications: no complications Patient tolerance: Patient tolerated the procedure well with no immediate complications   (including critical care time) Labs Review Labs Reviewed  BASIC METABOLIC PANEL - Abnormal; Notable for the following:    Potassium 3.4 (*)    Glucose, Bld 120 (*)    All other components within normal limits  CBC - Abnormal; Notable for the following:    WBC 16.2 (*)    Hemoglobin 17.2 (*)    All other components within normal limits  MAGNESIUM  TSH  I-STAT TROPOININ, ED    Imaging Review No results found.   EKG Interpretation   Date/Time:  Monday September 19 2014 15:27:36 EST Ventricular Rate:  221 PR Interval:  110 QRS Duration: 90 QT Interval:  232 QTC Calculation: 445 R Axis:   101 Text Interpretation:  Supraventricular tachycardia Borderline right axis  deviation Repolarization abnormality, prob rate related no old Confirmed  by Wilson Singer  MD, [place] ([id number]) on 09/19/2014 3:51:06 PM      MDM    Final diagnoses:  SVT (supraventricular tachycardia)    23 year old male with a rapid, regular narrow complex tachycardia with a rate of approximately 220 bpm. Converted to a sinus rhythm with adenosine. Symptoms have since resolved. No past history of SVT. We'll obtain some basic screening studies. Assuming these are unremarkable, patient remains in a sinus rhythm and symptom-free anticipate  discharge with cardiology follow-up.   Virgel Manifold, MD 09/19/14 [phone]

## 2014-09-19 NOTE — Discharge Instructions (Signed)
Supraventricular Tachycardia °Supraventricular tachycardia (SVT) is an abnormal heart rhythm (arrhythmia) that causes the heart to beat very fast (tachycardia). This kind of fast heartbeat originates in the upper chambers of the heart (atria). SVT can cause the heart to beat greater than 100 beats per minute. SVT can have a rapid burst of heartbeats. This can start and stop suddenly without warning and is called nonsustained. SVT can also be sustained, in which the heart beats at a continuous fast rate.  °CAUSES  °There can be different causes of SVT. Some of these include: °· Heart valve problems such as mitral valve prolapse. °· An enlarged heart (hypertrophic cardiomyopathy). °· Congenital heart problems. °· Heart inflammation (pericarditis). °· Hyperthyroidism. °· Low potassium or magnesium levels. °· Caffeine. °· Drug use such as cocaine, methamphetamines, or stimulants. °· Some over-the-counter medicines such as: °¨ Decongestants. °¨ Diet medicines. °¨ Herbal medicines. °SYMPTOMS  °Symptoms of SVT can vary. Symptoms depend on whether the SVT is sustained or nonsustained. You may experience: °· No symptoms (asymptomatic). °· An awareness of your heart beating rapidly (palpitations). °· Shortness of breath. °· Chest pain or pressure. °If your blood pressure drops because of the SVT, you may experience: °· Fainting or near fainting. °· Weakness. °· Dizziness. °DIAGNOSIS  °Different tests can be performed to diagnose SVT, such as: °· An electrocardiogram (EKG). This is a painless test that records the electrical activity of your heart. °· Holter monitor. This is a 24 hour recording of your heart rhythm. You will be given a diary. Write down all symptoms that you have and what you were doing at the time you experienced symptoms. °· Arrhythmia monitor. This is a small device that your wear for several weeks. It records the heart rhythm when you have symptoms. °· Echocardiogram. This is an imaging test to help detect  abnormal heart structure such as congenital abnormalities, heart valve problems, or heart enlargement. °· Stress test. This test can help determine if the SVT is related to exercise. °· Electrophysiology study (EPS). This is a procedure that evaluates your heart's electrical system and can help your caregiver find the cause of your SVT. °TREATMENT  °Treatment of SVT depends on the symptoms, how often it recurs, and whether there are any underlying heart problems.  °· If symptoms are rare and no other cardiac disease is present, no treatment may be needed. °· Blood work may be done to check potassium, magnesium, and thyroid hormone levels to see if they are abnormal. If these levels are abnormal, treatment to correct the problems will occur. °Medicines °Your caregiver may use oral medicines to treat SVT. These medicines are given for long-term control of SVT. Medicines may be used alone or in combination with other treatments. These medicines work to slow nerve impulses in the heart muscle. These medicines can also be used to treat high blood pressure. Some of these medicines may include: °· Calcium channel blockers. °· Beta blockers. °· Digoxin. °Nonsurgical procedures °Nonsurgical techniques may be used if oral medicines do not work. Some examples include: °· Cardioversion. This technique uses either drugs or an electrical shock to restore a normal heart rhythm. °¨ Cardioversion drugs may be given through an intravenous (IV) line to help "reset" the heart rhythm. °¨ In electrical cardioversion, the caregiver shocks your heart to stop its beat for a split second. This helps to reset the heart to a normal rhythm. °· Ablation. This procedure is done under mild sedation. High frequency radio wave energy is used to   destroy the area of heart tissue responsible for the SVT. °HOME CARE INSTRUCTIONS  °· Do not smoke. °· Only take medicines prescribed by your caregiver. Check with your caregiver before using over-the-counter  medicines. °· Check with your caregiver about how much alcohol and caffeine (coffee, tea, colas, or chocolate) you may have. °· It is very important to keep all follow-up referrals and appointments in order to properly manage this problem. °SEEK IMMEDIATE MEDICAL CARE IF: °· You have dizziness. °· You faint or nearly faint. °· You have shortness of breath. °· You have chest pain or pressure. °· You have sudden nausea or vomiting. °· You have profuse sweating. °· You are concerned about how long your symptoms last. °· You are concerned about the frequency of your SVT episodes. °If you have the above symptoms, call your local emergency services (911 in [place] immediately. Do not drive yourself to the hospital. °MAKE SURE YOU:  °· Understand these instructions. °· Will watch your condition. °· Will get help right away if you are not doing well or get worse. °Document Released: 09/09/2005 Document Revised: 12/02/2011 Document Reviewed: 12/22/2008 °[employer]® Patient Information ©2015 [employer]. This information is not intended to replace advice given to you by your health care provider. Make sure you discuss any questions you have with your health care provider. ° °

## 2014-09-19 NOTE — ED Notes (Signed)
Pt report was at work and was lifting a battery and reports palpitations began.

## 2014-09-29 ENCOUNTER — Ambulatory Visit (INDEPENDENT_AMBULATORY_CARE_PROVIDER_SITE_OTHER): Payer: BLUE CROSS/BLUE SHIELD | Admitting: Cardiovascular Disease

## 2014-09-29 ENCOUNTER — Encounter: Payer: Self-pay | Admitting: Cardiovascular Disease

## 2014-09-29 VITALS — BP 118/88 | HR 89 | Ht 70.0 in | Wt 238.0 lb

## 2014-09-29 DIAGNOSIS — I471 Supraventricular tachycardia: Secondary | ICD-10-CM

## 2014-09-29 DIAGNOSIS — D72829 Elevated white blood cell count, unspecified: Secondary | ICD-10-CM

## 2014-09-29 DIAGNOSIS — E876 Hypokalemia: Secondary | ICD-10-CM

## 2014-09-29 MED ORDER — DILTIAZEM HCL 30 MG PO TABS: 30.0000 mg | ORAL_TABLET | ORAL | Status: DC | PRN

## 2014-09-29 NOTE — Addendum Note (Signed)
Addended by: Julian Hy T on: 09/29/2014 03:27 PM   Modules accepted: Orders

## 2014-09-29 NOTE — Patient Instructions (Signed)
Your physician recommends that you schedule a follow-up appointment in: 4-6 weeks with Dr. Bronson Ing  Your physician has recommended you make the following change in your medication:   TAKE DILTIAZEM 30 MG AS NEEDED FOR PALPITATIONS  Your physician has requested that you have an echocardiogram. Echocardiography is a painless test that uses sound waves to create images of your heart. It provides your doctor with information about the size and shape of your heart and how well your heart's chambers and valves are working. This procedure takes approximately one hour. There are no restrictions for this procedure.   Thank you for choosing [facility]!!

## 2014-09-29 NOTE — Progress Notes (Signed)
Patient ID: Samuel Blackwell, male   DOB: 12/09/1990, 24 y.o.   MRN: [id number]       CARDIOLOGY CONSULT NOTE  Patient ID: Samuel Blackwell MRN: [id number] DOB/AGE: 1990-10-24 24 y.o.  Admit date: (Not on file) Primary Physician VYAS,DHRUV B., MD  Reason for Consultation: SVT  HPI: The patient is a 24 year old male who was recently evaluated in the ED on 09/19/2014 for tachycardia and palpitations who was found to be in SVT, heart rate 221 bpm. He converted to normal sinus rhythm with intravenous adenosine. Labs were notable for white count elevated at 16.2, elevated hemoglobin of 17.2, and mild hypokalemia of 3.4. Magnesium was 1.8 and TSH was normal at 2.5. I reviewed all labs and studies as well as the ECG from this evaluation. He denied any upper respiratory tract infections and fevers prior to this episode. He had been lifting a heavy battery and then suddenly dropped it prior to experiencing palpitations. He denies a history of drug and alcohol use. He used to smoke cigarettes but quit 6 months ago. He then started electronic cigarettes and then started dipping. Since his episode of SVT, he has quit all of this. He used to drink up to 10-12 caffeinated beverages daily but has since quit and is now only drinking water. For a few days after his hospitalization, he felt fatigued with some mild chest discomfort but this has since resolved. When he takes a shower, he becomes anxious thinking that his SVT will return and he notices that his heart rate is elevated. He then cools off with cold water and feels better. He denies orthopnea, dizziness, and leg swelling. He monitors his HR with an app on his phone.  He works at [employer] in [place].  No Known Allergies  Current Outpatient Prescriptions  Medication Sig Dispense Refill  . HYDROcodone-acetaminophen (NORCO/VICODIN) 5-325 MG per tablet Take 1 tablet by mouth every 4 (four) hours as needed. 15 tablet 0  . ibuprofen (ADVIL,MOTRIN) 200 MG  tablet Take 200 mg by mouth every 6 (six) hours as needed for mild pain or moderate pain.     No current facility-administered medications for this visit.    Past Medical History  Diagnosis Date  . Migraines     No past surgical history on file.  History   Social History  . Marital Status: Single    Spouse Name: N/A    Number of Children: N/A  . Years of Education: N/A   Occupational History  . Not on file.   Social History Main Topics  . Smoking status: Former Smoker -- 0.25 packs/day    Types: Cigarettes  . Smokeless tobacco: Former Systems developer    Types: [place] date: 09/19/2014  . Alcohol Use: 0.0 oz/week    0 Not specified per week  . Drug Use: No  . Sexual Activity: Not on file   Other Topics Concern  . Not on file   Social History Narrative     No family history of premature CAD in 1st degree relatives.  Prior to Admission medications   Medication Sig Start Date End Date Taking? Authorizing Provider  HYDROcodone-acetaminophen (NORCO/VICODIN) 5-325 MG per tablet Take 1 tablet by mouth every 4 (four) hours as needed. 02/20/13  Yes Hope Bunnie Pion, NP  ibuprofen (ADVIL,MOTRIN) 200 MG tablet Take 200 mg by mouth every 6 (six) hours as needed for mild pain or moderate pain.   Yes Historical Provider, MD     Review  of systems complete and found to be negative unless listed above in HPI     Physical exam Blood pressure 118/88, pulse 89, height 5\' 10"  (1.778 m), weight 238 lb (107.956 kg). General: NAD Neck: No JVD, no thyromegaly or thyroid nodule.  Lungs: Clear to auscultation bilaterally with normal respiratory effort. CV: Nondisplaced PMI. Regular rate and rhythm, normal S1/S2, no S3/S4, no murmur.  No peripheral edema.  No carotid bruit.    Abdomen: Soft, nontender, no distention.  Skin: Intact without lesions or rashes.  Neurologic: Alert and oriented x 3.  Psych: Normal affect. Extremities: No clubbing or cyanosis.  HEENT: Normal.   ECG: Most recent  ECG reviewed.  Labs:   Lab Results  Component Value Date   WBC 16.2* 09/19/2014   HGB 17.2* 09/19/2014   HCT 48.8 09/19/2014   MCV 84.1 09/19/2014   PLT 368 09/19/2014   No results for input(s): NA, K, CL, CO2, BUN, CREATININE, CALCIUM, PROT, BILITOT, ALKPHOS, ALT, AST, GLUCOSE in the last 168 hours.  Invalid input(s): LABALBU No results found for: CKTOTAL, CKMB, CKMBINDEX, TROPONINI No results found for: CHOL No results found for: HDL No results found for: LDLCALC No results found for: TRIG No results found for: CHOLHDL No results found for: LDLDIRECT       Studies: No results found.  ASSESSMENT AND PLAN:  1. SVT: This may have been triggered by an occult infection and mild hypokalemia. There have been no recurrences. He has quit caffeineated beverages. I educated him on the performance of Valsalva maneuvers to attenuate further episodes. I will obtain an echocardiogram to evaluate for structural heart disease. I will also prescribe diltiazem 30 mg to be used as needed for palpitations. If he has recurrences of tachycardia and palpitations, I would consider event monitoring. I also discussed the possibility of ablation. For the time being, I will proceed with conservative management.  Dispo: f/u 4-6 weeks.  Signed: Kate Sable, M.D., F.A.C.C.  09/29/2014, 2:59 PM

## 2014-10-10 ENCOUNTER — Ambulatory Visit (HOSPITAL_COMMUNITY)
Admission: RE | Admit: 2014-10-10 | Discharge: 2014-10-10 | Disposition: A | Discharge status: Home / Self Care | Payer: BLUE CROSS/BLUE SHIELD | Source: Ambulatory Visit | Attending: Cardiovascular Disease | Admitting: Cardiovascular Disease

## 2014-10-10 DIAGNOSIS — Z87891 Personal history of nicotine dependence: Secondary | ICD-10-CM | POA: Diagnosis not present

## 2014-10-10 DIAGNOSIS — I471 Supraventricular tachycardia: Secondary | ICD-10-CM | POA: Insufficient documentation

## 2014-10-10 DIAGNOSIS — I517 Cardiomegaly: Secondary | ICD-10-CM

## 2014-10-10 NOTE — Progress Notes (Signed)
  Echocardiogram 2D Echocardiogram has been performed.  [place], [place] 10/10/2014, 12:27 PM

## 2014-10-21 ENCOUNTER — Encounter: Payer: Self-pay | Admitting: Cardiovascular Disease

## 2014-10-21 ENCOUNTER — Ambulatory Visit (INDEPENDENT_AMBULATORY_CARE_PROVIDER_SITE_OTHER): Payer: BLUE CROSS/BLUE SHIELD | Admitting: Cardiovascular Disease

## 2014-10-21 VITALS — BP 122/74 | HR 83 | Ht 68.0 in | Wt 238.0 lb

## 2014-10-21 DIAGNOSIS — I519 Heart disease, unspecified: Secondary | ICD-10-CM

## 2014-10-21 DIAGNOSIS — I5189 Other ill-defined heart diseases: Secondary | ICD-10-CM

## 2014-10-21 DIAGNOSIS — I471 Supraventricular tachycardia: Secondary | ICD-10-CM

## 2014-10-21 NOTE — Progress Notes (Signed)
Patient ID: Samuel Blackwell, male   DOB: September 12, 1991, 23 y.o.   MRN: [id number]      SUBJECTIVE: The patient presents for follow up of SVT.  Echocardiography demonstrated normal left ventricular systolic function, EF 67-89%, mild LVH, and grade 2 diastolic dysfunction. He has been feeling well and denies palpitations, chest pain, shortness of breath, and dizziness. He is now taking Xanax for anxiety. Zoloft only made him feel more angry. He does not drink caffeinated beverages any longer and primarily drinks water or caffeine free [facility]. He is back to work at [employer].  Review of Systems: As per "subjective", otherwise negative.  No Known Allergies  Current Outpatient Prescriptions  Medication Sig Dispense Refill  . diltiazem (CARDIZEM) 30 MG tablet Take 1 tablet (30 mg total) by mouth as needed. 30 tablet 3  . HYDROcodone-acetaminophen (NORCO/VICODIN) 5-325 MG per tablet Take 1 tablet by mouth every 4 (four) hours as needed. 15 tablet 0  . ibuprofen (ADVIL,MOTRIN) 200 MG tablet Take 200 mg by mouth every 6 (six) hours as needed for mild pain or moderate pain.     No current facility-administered medications for this visit.    Past Medical History  Diagnosis Date  . Migraines     No past surgical history on file.  History   Social History  . Marital Status: Single    Spouse Name: N/A    Number of Children: N/A  . Years of Education: N/A   Occupational History  . Not on file.   Social History Main Topics  . Smoking status: Former Smoker -- 0.25 packs/day    Types: Cigarettes  . Smokeless tobacco: Former Systems developer    Types: [place] date: 09/19/2014  . Alcohol Use: 0.0 oz/week    0 Not specified per week  . Drug Use: No  . Sexual Activity: Not on file   Other Topics Concern  . Not on file   Social History Narrative     BP 122/74  Pulse 83  SpO2 94% Weight 238 lb (107.956 kg) Height 5\' 8"  (1.727 m)   PHYSICAL EXAM General: NAD HEENT: Normal. Neck: No  JVD, no thyromegaly. Lungs: Clear to auscultation bilaterally with normal respiratory effort. CV: Nondisplaced PMI.  Regular rate and rhythm, normal S1/S2, no S3/S4, no murmur. No pretibial or periankle edema.  No carotid bruit.  Normal pedal pulses.  Abdomen: Soft, nontender, no hepatosplenomegaly, no distention.  Neurologic: Alert and oriented x 3.  Psych: Normal affect. Skin: Normal. Musculoskeletal: Normal range of motion, no gross deformities. Extremities: No clubbing or cyanosis.   ECG: Most recent ECG reviewed.      ASSESSMENT AND PLAN: 1. SVT: Symptomatically stable. This may have been triggered by an occult infection and mild hypokalemia. There have been no recurrences. He has quit caffeineated beverages. I previously educated him on the performance of Valsalva maneuvers to attenuate further episodes, and reminded him again as to how to perform them. I will continue diltiazem 30 mg to be used as needed for palpitations. If he has recurrences of tachycardia and palpitations, I would consider event monitoring. I also discussed the possibility of ablation. For the time being, I will proceed with conservative management. 2. Grade 2 diastolic dysfunction: No evidence of heart failure. Continue present management.  Dispo: f/u 6 months.   Kate Sable, M.D., F.A.C.C.

## 2014-10-21 NOTE — Patient Instructions (Signed)

## 2015-04-21 ENCOUNTER — Encounter (HOSPITAL_COMMUNITY): Payer: Self-pay | Admitting: Emergency Medicine

## 2015-04-21 ENCOUNTER — Emergency Department (HOSPITAL_COMMUNITY)
Admission: EM | Admit: 2015-04-21 | Discharge: 2015-04-21 | Disposition: A | Discharge status: Home / Self Care | Payer: BLUE CROSS/BLUE SHIELD | Attending: Emergency Medicine | Admitting: Emergency Medicine

## 2015-04-21 ENCOUNTER — Emergency Department (HOSPITAL_COMMUNITY): Payer: BLUE CROSS/BLUE SHIELD

## 2015-04-21 DIAGNOSIS — Z87891 Personal history of nicotine dependence: Secondary | ICD-10-CM | POA: Insufficient documentation

## 2015-04-21 DIAGNOSIS — Z79899 Other long term (current) drug therapy: Secondary | ICD-10-CM | POA: Insufficient documentation

## 2015-04-21 DIAGNOSIS — R000 Tachycardia, unspecified: Secondary | ICD-10-CM | POA: Diagnosis present

## 2015-04-21 DIAGNOSIS — R0602 Shortness of breath: Secondary | ICD-10-CM | POA: Diagnosis not present

## 2015-04-21 DIAGNOSIS — Z8679 Personal history of other diseases of the circulatory system: Secondary | ICD-10-CM | POA: Insufficient documentation

## 2015-04-21 DIAGNOSIS — R002 Palpitations: Secondary | ICD-10-CM | POA: Diagnosis not present

## 2015-04-21 HISTORY — DX: Supraventricular tachycardia: I47.1

## 2015-04-21 HISTORY — DX: Supraventricular tachycardia, unspecified: I47.10

## 2015-04-21 LAB — CBC WITH DIFFERENTIAL/PLATELET
BASOS ABS: 0 10*3/uL (ref 0.0–0.1)
BASOS PCT: 0 % (ref 0–1)
EOS PCT: 0 % (ref 0–5)
Eosinophils Absolute: 0 10*3/uL (ref 0.0–0.7)
HEMATOCRIT: 45.7 % (ref 39.0–52.0)
Hemoglobin: 16.1 g/dL (ref 13.0–17.0)
LYMPHS ABS: 2.4 10*3/uL (ref 0.7–4.0)
Lymphocytes Relative: 19 % (ref 12–46)
MCH: 29.2 pg (ref 26.0–34.0)
MCHC: 35.2 g/dL (ref 30.0–36.0)
MCV: 82.9 fL (ref 78.0–100.0)
MONOS PCT: 11 % (ref 3–12)
Monocytes Absolute: 1.4 10*3/uL — ABNORMAL HIGH (ref 0.1–1.0)
Neutro Abs: 9.1 10*3/uL — ABNORMAL HIGH (ref 1.7–7.7)
Neutrophils Relative %: 70 % (ref 43–77)
Platelets: 264 10*3/uL (ref 150–400)
RBC: 5.51 MIL/uL (ref 4.22–5.81)
RDW: 12.5 % (ref 11.5–15.5)
WBC: 13.1 10*3/uL — AB (ref 4.0–10.5)

## 2015-04-21 LAB — COMPREHENSIVE METABOLIC PANEL
ALBUMIN: 4.6 g/dL (ref 3.5–5.0)
ALT: 41 U/L (ref 17–63)
ANION GAP: 8 (ref 5–15)
AST: 21 U/L (ref 15–41)
Alkaline Phosphatase: 85 U/L (ref 38–126)
BILIRUBIN TOTAL: 1.4 mg/dL — AB (ref 0.3–1.2)
BUN: 14 mg/dL (ref 6–20)
CO2: 25 mmol/L (ref 22–32)
Calcium: 9.2 mg/dL (ref 8.9–10.3)
Chloride: 104 mmol/L (ref 101–111)
Creatinine, Ser: 0.94 mg/dL (ref 0.61–1.24)
Glucose, Bld: 109 mg/dL — ABNORMAL HIGH (ref 65–99)
POTASSIUM: 3.3 mmol/L — AB (ref 3.5–5.1)
Sodium: 137 mmol/L (ref 135–145)
Total Protein: 7.3 g/dL (ref 6.5–8.1)

## 2015-04-21 LAB — D-DIMER, QUANTITATIVE: D-Dimer, Quant: <0.27 ug/mL-FEU (ref 0.00–0.48)

## 2015-04-21 MED ORDER — POTASSIUM CHLORIDE CRYS ER 20 MEQ PO TBCR
40.0000 meq | EXTENDED_RELEASE_TABLET | Freq: Once | ORAL | Status: AC
  Administered 2015-04-21: 40 meq via ORAL
  Filled 2015-04-21: qty 2

## 2015-04-21 NOTE — ED Provider Notes (Addendum)
CSN: [id number]     Arrival date & time 04/21/15  2011 History  This chart was scribed for Samuel Essex, MD by Eustaquio Maize, ED Scribe. This patient was seen in room APA14/APA14 and the patient's care was started at 8:58 PM.  Chief Complaint  Patient presents with  . Tachycardia   The history is provided by the patient. No language interpreter was used.     HPI Comments: Samuel Blackwell is a 24 y.o. male who presents to the Emergency Department complaining of gradual onset tachycardia that began tonight around 6 PM (approximately 3 hours ago) while going to dinner. Pt states that his HR was approximately 150 bpm. He attempted to place cold compress and take a cold shower without relief. Pt took 30 mg Diltiazem tonight around 7:20 PM (1.5 hours ago) with mild relief, bringing HR down to 120. Pt states that the tachycardia lasted approximately 2 hours. He also complains of mild shortness of breath during the tachycardia. Denies chest pain or any other associated symptoms. Pt reports similar symptoms in December 2015. He mentions that he was seen in the ED at that time with SVT and given the prescription for diltiazem PRN. He has not had to take the diltiazem until today. He notes that he drank EtOH last weekend and that his heart rate has not felt at baseline since.   Past Medical History  Diagnosis Date  . Migraines   . SVT (supraventricular tachycardia)    History reviewed. No pertinent past surgical history. History reviewed. No pertinent family history. History  Substance Use Topics  . Smoking status: Former Smoker -- 0.25 packs/day    Types: Cigarettes    Start date: 12/01/2007    Quit date: 03/23/2014  . Smokeless tobacco: Former Systems developer    Types: [place] date: 09/19/2014  . Alcohol Use: 0.0 oz/week    0 Shots of liquor per week     Comment: rare on occasion     Review of Systems  A complete 10 system review of systems was obtained and all systems are negative except as  noted in the HPI and PMH.    Allergies  Review of patient's allergies indicates no known allergies.  Home Medications   Prior to Admission medications   Medication Sig Start Date End Date Taking? Authorizing Provider  ALPRAZolam [name]) 0.5 MG tablet Take 0.5 mg by mouth daily.   Yes Historical Provider, MD  diltiazem (CARDIZEM) 30 MG tablet Take 1 tablet (30 mg total) by mouth as needed. Patient taking differently: Take 30 mg by mouth daily as needed.  09/29/14  Yes Herminio Commons, MD  HYDROcodone-acetaminophen (NORCO/VICODIN) 5-325 MG per tablet Take 1 tablet by mouth daily as needed for moderate pain.   Yes Historical Provider, MD  ibuprofen (ADVIL,MOTRIN) 800 MG tablet Take 800 mg by mouth every 8 (eight) hours as needed for mild pain or moderate pain.    Yes Historical Provider, MD  omeprazole (PRILOSEC) 40 MG capsule Take 40 mg by mouth daily.   Yes Historical Provider, MD   Triage Vitals: BP 152/99 mmHg  Pulse 113  Temp(Src) 98.7 F (37.1 C) (Oral)  Resp 22  Ht 5\' 8"  (1.727 m)  Wt 240 lb (108.863 kg)  BMI 36.50 kg/m2  SpO2 99%   Physical Exam  Constitutional: He is oriented to person, place, and time. He appears well-developed and well-nourished. No distress.  HENT:  Head: Normocephalic and atraumatic.  Mouth/Throat: Oropharynx is clear and  moist. No oropharyngeal exudate.  Eyes: Conjunctivae and EOM are normal. Pupils are equal, round, and reactive to light.  Neck: Normal range of motion. Neck supple.  No meningismus.  Cardiovascular: Regular rhythm, normal heart sounds and intact distal pulses.  Tachycardia present.   No murmur heard. Pulmonary/Chest: Effort normal and breath sounds normal. No respiratory distress.  Abdominal: Soft. There is no tenderness. There is no rebound and no guarding.  Musculoskeletal: Normal range of motion. He exhibits no edema or tenderness.  Neurological: He is alert and oriented to person, place, and time. No cranial nerve deficit. He  exhibits normal muscle tone. Coordination normal.  No ataxia on finger to nose bilaterally. No pronator drift. 5/5 strength throughout. CN 2-12 intact. Negative Romberg. Equal grip strength. Sensation intact. Gait is normal.   Skin: Skin is warm.  Psychiatric: He has a normal mood and affect. His behavior is normal.  Nursing note and vitals reviewed.   ED Course  Procedures (including critical care time)  DIAGNOSTIC STUDIES: Oxygen Saturation is 99% on RA, normal by my interpretation.    COORDINATION OF CARE: 9:04 PM-Discussed treatment plan with pt at bedside and pt agreed to plan.   Labs Review Labs Reviewed  CBC WITH DIFFERENTIAL/PLATELET - Abnormal; Notable for the following:    WBC 13.1 (*)    Neutro Abs 9.1 (*)    Monocytes Absolute 1.4 (*)    All other components within normal limits  COMPREHENSIVE METABOLIC PANEL - Abnormal; Notable for the following:    Potassium 3.3 (*)    Glucose, Bld 109 (*)    Total Bilirubin 1.4 (*)    All other components within normal limits  D-DIMER, QUANTITATIVE (NOT AT [facility])    Imaging Review Dg Chest 2 View  04/21/2015   CLINICAL DATA:  Chest pain. Dizziness. Increased heart rate. History of SVT  EXAM: CHEST  2 VIEW  COMPARISON:  None.  FINDINGS: The heart size and mediastinal contours are within normal limits. Both lungs are clear. The visualized skeletal structures are unremarkable.  IMPRESSION: No active cardiopulmonary disease.   Electronically Signed   By: Rolm Baptise M.D.   On: 04/21/2015 21:41     EKG Interpretation   Date/Time:  Friday April 21 2015 20:22:45 EDT Ventricular Rate:  102 PR Interval:  160 QRS Duration: 94 QT Interval:  340 QTC Calculation: 443 R Axis:   8 Text Interpretation:  Sinus tachycardia Incomplete right bundle branch  block Borderline ECG SVT resolved Confirmed by Wyvonnia Dusky  MD, [place]  [phone]) on 04/21/2015 8:52:54 PM      MDM   Final diagnoses:  Palpitations   episode of racing heart for several  hours at home. Denies chest pain or shortness of breath. Improved after bearing down, cold showers and diltiazem at home.   patient remains in sinus rhythm. Electrolytes are normal. D-dimer is negative.  We'll give additional potassium.   Patient likely had another episode of SVT. He has diltiazem at home as needed for palpitations. Advised to call cardiology for appointment next week. Return precautions discussed.  I personally performed the services described in this documentation, which was scribed in my presence. The recorded information has been reviewed and is accurate.   Samuel Essex, MD 04/21/15 1027  Samuel Essex, MD 04/21/15 2536

## 2015-04-21 NOTE — Discharge Instructions (Signed)
Supraventricular Tachycardia °Supraventricular tachycardia (SVT) is an abnormal heart rhythm (arrhythmia) that causes the heart to beat very fast (tachycardia). This kind of fast heartbeat originates in the upper chambers of the heart (atria). SVT can cause the heart to beat greater than 100 beats per minute. SVT can have a rapid burst of heartbeats. This can start and stop suddenly without warning and is called nonsustained. SVT can also be sustained, in which the heart beats at a continuous fast rate.  °CAUSES  °There can be different causes of SVT. Some of these include: °· Heart valve problems such as mitral valve prolapse. °· An enlarged heart (hypertrophic cardiomyopathy). °· Congenital heart problems. °· Heart inflammation (pericarditis). °· Hyperthyroidism. °· Low potassium or magnesium levels. °· Caffeine. °· Drug use such as cocaine, methamphetamines, or stimulants. °· Some over-the-counter medicines such as: °¨ Decongestants. °¨ Diet medicines. °¨ Herbal medicines. °SYMPTOMS  °Symptoms of SVT can vary. Symptoms depend on whether the SVT is sustained or nonsustained. You may experience: °· No symptoms (asymptomatic). °· An awareness of your heart beating rapidly (palpitations). °· Shortness of breath. °· Chest pain or pressure. °If your blood pressure drops because of the SVT, you may experience: °· Fainting or near fainting. °· Weakness. °· Dizziness. °DIAGNOSIS  °Different tests can be performed to diagnose SVT, such as: °· An electrocardiogram (EKG). This is a painless test that records the electrical activity of your heart. °· Holter monitor. This is a 24 hour recording of your heart rhythm. You will be given a diary. Write down all symptoms that you have and what you were doing at the time you experienced symptoms. °· Arrhythmia monitor. This is a small device that your wear for several weeks. It records the heart rhythm when you have symptoms. °· Echocardiogram. This is an imaging test to help detect  abnormal heart structure such as congenital abnormalities, heart valve problems, or heart enlargement. °· Stress test. This test can help determine if the SVT is related to exercise. °· Electrophysiology study (EPS). This is a procedure that evaluates your heart's electrical system and can help your caregiver find the cause of your SVT. °TREATMENT  °Treatment of SVT depends on the symptoms, how often it recurs, and whether there are any underlying heart problems.  °· If symptoms are rare and no other cardiac disease is present, no treatment may be needed. °· Blood work may be done to check potassium, magnesium, and thyroid hormone levels to see if they are abnormal. If these levels are abnormal, treatment to correct the problems will occur. °Medicines °Your caregiver may use oral medicines to treat SVT. These medicines are given for long-term control of SVT. Medicines may be used alone or in combination with other treatments. These medicines work to slow nerve impulses in the heart muscle. These medicines can also be used to treat high blood pressure. Some of these medicines may include: °· Calcium channel blockers. °· Beta blockers. °· Digoxin. °Nonsurgical procedures °Nonsurgical techniques may be used if oral medicines do not work. Some examples include: °· Cardioversion. This technique uses either drugs or an electrical shock to restore a normal heart rhythm. °¨ Cardioversion drugs may be given through an intravenous (IV) line to help "reset" the heart rhythm. °¨ In electrical cardioversion, the caregiver shocks your heart to stop its beat for a split second. This helps to reset the heart to a normal rhythm. °· Ablation. This procedure is done under mild sedation. High frequency radio wave energy is used to   destroy the area of heart tissue responsible for the SVT. °HOME CARE INSTRUCTIONS  °· Do not smoke. °· Only take medicines prescribed by your caregiver. Check with your caregiver before using over-the-counter  medicines. °· Check with your caregiver about how much alcohol and caffeine (coffee, tea, colas, or chocolate) you may have. °· It is very important to keep all follow-up referrals and appointments in order to properly manage this problem. °SEEK IMMEDIATE MEDICAL CARE IF: °· You have dizziness. °· You faint or nearly faint. °· You have shortness of breath. °· You have chest pain or pressure. °· You have sudden nausea or vomiting. °· You have profuse sweating. °· You are concerned about how long your symptoms last. °· You are concerned about the frequency of your SVT episodes. °If you have the above symptoms, call your local emergency services (911 in [place] immediately. Do not drive yourself to the hospital. °MAKE SURE YOU:  °· Understand these instructions. °· Will watch your condition. °· Will get help right away if you are not doing well or get worse. °Document Released: 09/09/2005 Document Revised: 12/02/2011 Document Reviewed: 12/22/2008 °[employer]® Patient Information ©2015 [employer]. This information is not intended to replace advice given to you by your health care provider. Make sure you discuss any questions you have with your health care provider. ° °

## 2015-04-21 NOTE — ED Notes (Signed)
Patient states he feels likes his "heart is racing", patient says he was recently started on Diltiazem as needed for high heart rate. Radial pulse 110, patient in NAD.

## 2015-04-21 NOTE — ED Notes (Signed)
Patient states he started having fast heart rate today at 1800. States he was seen here a few months ago for SVT and put on diltiazem PRN for fast heart rate. States he has not had to take the medication until today. States heart rate at home was 160. States he took medication approximately one hour prior to arrival to ED. Patient complaining of dizziness upon standing. Denies chest pain.

## 2015-04-27 ENCOUNTER — Ambulatory Visit (INDEPENDENT_AMBULATORY_CARE_PROVIDER_SITE_OTHER): Payer: BLUE CROSS/BLUE SHIELD | Admitting: Cardiology

## 2015-04-27 ENCOUNTER — Encounter: Payer: Self-pay | Admitting: Cardiology

## 2015-04-27 VITALS — BP 130/80 | HR 97 | Ht 68.0 in | Wt 229.6 lb

## 2015-04-27 DIAGNOSIS — I471 Supraventricular tachycardia: Secondary | ICD-10-CM

## 2015-04-27 MED ORDER — HYDROCODONE-ACETAMINOPHEN 5-325 MG PO TABS: 1.0000 | ORAL_TABLET | Freq: Every day | ORAL | Status: DC | PRN

## 2015-04-27 MED ORDER — METOPROLOL SUCCINATE ER 25 MG PO TB24: 25.0000 mg | ORAL_TABLET | Freq: Every day | ORAL | Status: DC

## 2015-04-27 MED ORDER — ISOSORBIDE MONONITRATE ER 30 MG PO TB24
30.0000 mg | ORAL_TABLET | Freq: Every day | ORAL | Status: DC
Start: 2015-04-27 — End: 2015-04-27

## 2015-04-27 NOTE — Progress Notes (Signed)
04/27/2015 Samuel Blackwell   1990/11/18  [id number]  Primary Physician Jamal Maes, PA-C Primary Cardiologist: Dr Bronson Ing  HPI:  24 year old male who was evaluated in the ED on 09/19/2014 for tachycardia and palpitations who was found to be in SVT, heart rate 221 bpm. He converted to normal sinus rhythm with intravenous adenosine. Labs were notable for white count elevated at 16.2, elevated hemoglobin of 17.2, and mild hypokalemia of 3.4. Magnesium was 1.8 and TSH was normal at 2.5. The pt was instructed to avoid caffeine and any OTC stimulants and given an Rx for PRN Diltiazem 30 mg.  He was seen again in the ED 04/25/15 with recurrent tachycardia though it appers he broke before an EKG could document it. The pt tells me today he had another episode at work yesterday. He is very symptomatic with the episode and his wife says he is so anxious about having another episode that they have cancelled their honeymoon.     Current Outpatient Prescriptions  Medication Sig Dispense Refill  . ALPRAZolam (XANAX) 0.5 MG tablet Take 0.5 mg by mouth daily.    Marland Kitchen diltiazem (CARDIZEM) 30 MG tablet Take 1 tablet (30 mg total) by mouth as needed. (Patient taking differently: Take 30 mg by mouth daily as needed. ) 30 tablet 3  . ibuprofen (ADVIL,MOTRIN) 800 MG tablet Take 800 mg by mouth every 8 (eight) hours as needed for mild pain or moderate pain.     Marland Kitchen omeprazole (PRILOSEC) 40 MG capsule Take 40 mg by mouth daily.    . Potassium 99 MG TABS Take 99 mg by mouth.    Marland Kitchen HYDROcodone-acetaminophen (NORCO/VICODIN) 5-325 MG per tablet Take 1 tablet by mouth daily as needed for moderate pain. 20 tablet 0  . metoprolol succinate (TOPROL XL) 25 MG 24 hr tablet Take 1 tablet (25 mg total) by mouth daily. 30 tablet 11   No current facility-administered medications for this visit.    No Known Allergies  History   Social History  . Marital Status: Single    Spouse Name: N/A  . Number of Children: N/A  . Years of  Education: N/A   Occupational History  . Not on file.   Social History Main Topics  . Smoking status: Former Smoker -- 0.25 packs/day    Types: Cigarettes    Start date: 12/01/2007    Quit date: 03/23/2014  . Smokeless tobacco: Former Systems developer    Types: [place] date: 09/19/2014  . Alcohol Use: 0.0 oz/week    0 Shots of liquor per week     Comment: rare on occasion   . Drug Use: No  . Sexual Activity:    Partners: Female   Other Topics Concern  . Not on file   Social History Narrative     Review of Systems: General: negative for chills, fever, night sweats or weight changes.  Cardiovascular: negative for chest pain, dyspnea on exertion, edema, orthopnea, palpitations, paroxysmal nocturnal dyspnea or shortness of breath Dermatological: negative for rash Respiratory: negative for cough or wheezing Urologic: negative for hematuria Abdominal: negative for nausea, vomiting, diarrhea, bright red blood per rectum, melena, or hematemesis Neurologic: negative for visual changes, syncope, or dizziness All other systems reviewed and are otherwise negative except as noted above.    Blood pressure 130/80, pulse 97, height 5\' 8"  (1.727 m), weight 229 lb 9.6 oz (104.146 kg), SpO2 98 %.  General appearance: alert, cooperative, no distress and mildly obese Neck: no carotid bruit  and no JVD Lungs: clear to auscultation bilaterally Heart: regular rate and rhythm Extremities: extremities normal, atraumatic, no cyanosis or edema Pulses: 2+ and symmetric Skin: Skin color, texture, turgor normal. No rashes or lesions Neurologic: Grossly normal  EKG NSR 04/21/15 with incomplete RBBB  ASSESSMENT AND PLAN:   SVT (supraventricular tachycardia) Pt seen in the ED Dec 2015 and again 04/25/15 with recurrent, symptomatic PSVT.   PLAN  I added Toprol 25 mg daily and will refer him to EP.   Kerin Ransom K PA-C 04/27/2015 2:33 PM

## 2015-04-27 NOTE — Assessment & Plan Note (Signed)
Pt seen in the ED Dec 2015 and again 04/25/15 with recurrent, symptomatic PSVT.

## 2015-04-27 NOTE — Patient Instructions (Signed)
Your physician recommends that you schedule a follow-up appointment with the Electrophysiology Doctor    Start Toprol XL 25 mg Daily  Thank you for choosing [facility]!

## 2015-04-30 ENCOUNTER — Emergency Department (HOSPITAL_COMMUNITY)
Admission: EM | Admit: 2015-04-30 | Discharge: 2015-04-30 | Disposition: A | Discharge status: Home / Self Care | Payer: BLUE CROSS/BLUE SHIELD | Attending: Emergency Medicine | Admitting: Emergency Medicine

## 2015-04-30 ENCOUNTER — Encounter (HOSPITAL_COMMUNITY): Payer: Self-pay | Admitting: Emergency Medicine

## 2015-04-30 DIAGNOSIS — G43909 Migraine, unspecified, not intractable, without status migrainosus: Secondary | ICD-10-CM | POA: Diagnosis not present

## 2015-04-30 DIAGNOSIS — R002 Palpitations: Secondary | ICD-10-CM | POA: Insufficient documentation

## 2015-04-30 DIAGNOSIS — R0789 Other chest pain: Secondary | ICD-10-CM | POA: Insufficient documentation

## 2015-04-30 DIAGNOSIS — Z87891 Personal history of nicotine dependence: Secondary | ICD-10-CM | POA: Diagnosis not present

## 2015-04-30 DIAGNOSIS — R000 Tachycardia, unspecified: Secondary | ICD-10-CM | POA: Diagnosis present

## 2015-04-30 DIAGNOSIS — I951 Orthostatic hypotension: Secondary | ICD-10-CM | POA: Diagnosis not present

## 2015-04-30 DIAGNOSIS — Z79899 Other long term (current) drug therapy: Secondary | ICD-10-CM | POA: Insufficient documentation

## 2015-04-30 LAB — BASIC METABOLIC PANEL
ANION GAP: 12 (ref 5–15)
BUN: 10 mg/dL (ref 6–20)
CALCIUM: 10 mg/dL (ref 8.9–10.3)
CHLORIDE: 102 mmol/L (ref 101–111)
CO2: 25 mmol/L (ref 22–32)
CREATININE: 0.92 mg/dL (ref 0.61–1.24)
GFR calc Af Amer: >60 mL/min (ref >60)
Glucose, Bld: 96 mg/dL (ref 65–99)
Potassium: 4.1 mmol/L (ref 3.5–5.1)
Sodium: 139 mmol/L (ref 135–145)

## 2015-04-30 LAB — CBC WITH DIFFERENTIAL/PLATELET
BASOS ABS: 0 10*3/uL (ref 0.0–0.1)
Basophils Relative: 0 % (ref 0–1)
EOS ABS: 0.2 10*3/uL (ref 0.0–0.7)
Eosinophils Relative: 1 % (ref 0–5)
HEMATOCRIT: 48 % (ref 39.0–52.0)
Hemoglobin: 17.2 g/dL — ABNORMAL HIGH (ref 13.0–17.0)
Lymphocytes Relative: 15 % (ref 12–46)
Lymphs Abs: 2.6 10*3/uL (ref 0.7–4.0)
MCH: 30.1 pg (ref 26.0–34.0)
MCHC: 35.8 g/dL (ref 30.0–36.0)
MCV: 84.1 fL (ref 78.0–100.0)
MONOS PCT: 9 % (ref 3–12)
Monocytes Absolute: 1.6 10*3/uL — ABNORMAL HIGH (ref 0.1–1.0)
NEUTROS PCT: 75 % (ref 43–77)
Neutro Abs: 13.1 10*3/uL — ABNORMAL HIGH (ref 1.7–7.7)
Platelets: 278 10*3/uL (ref 150–400)
RBC: 5.71 MIL/uL (ref 4.22–5.81)
RDW: 12.6 % (ref 11.5–15.5)
WBC: 17.5 10*3/uL — ABNORMAL HIGH (ref 4.0–10.5)

## 2015-04-30 LAB — D-DIMER, QUANTITATIVE (NOT AT ARMC): D-Dimer, Quant: <0.27 ug/mL-FEU (ref 0.00–0.48)

## 2015-04-30 LAB — RAPID STREP SCREEN (MED CTR MEBANE ONLY): STREPTOCOCCUS, GROUP A SCREEN (DIRECT): NEGATIVE

## 2015-04-30 MED ORDER — SODIUM CHLORIDE 0.9 % IV BOLUS (SEPSIS)
1000.0000 mL | Freq: Once | INTRAVENOUS | Status: AC
  Administered 2015-04-30: 1000 mL via INTRAVENOUS

## 2015-04-30 NOTE — Discharge Instructions (Signed)
Palpitations Take your Toprol as prescribed. Follow-up with a cardiologist as scheduled. Keep yourself hydrated. Return to the ED if you develop new or worsening symptoms. A palpitation is the feeling that your heartbeat is irregular or is faster than normal. It may feel like your heart is fluttering or skipping a beat. Palpitations are usually not a serious problem. However, in some cases, you may need further medical evaluation. CAUSES  Palpitations can be caused by:  Smoking.  Caffeine or other stimulants, such as diet pills or energy drinks.  Alcohol.  Stress and anxiety.  Strenuous physical activity.  Fatigue.  Certain medicines.  Heart disease, especially if you have a history of irregular heart rhythms (arrhythmias), such as atrial fibrillation, atrial flutter, or supraventricular tachycardia.  An improperly working pacemaker or defibrillator. DIAGNOSIS  To find the cause of your palpitations, your health care provider will take your medical history and perform a physical exam. Your health care provider may also have you take a test called an ambulatory electrocardiogram (ECG). An ECG records your heartbeat patterns over a 24-hour period. You may also have other tests, such as:  Transthoracic echocardiogram (TTE). During echocardiography, sound waves are used to evaluate how blood flows through your heart.  Transesophageal echocardiogram (TEE).  Cardiac monitoring. This allows your health care provider to monitor your heart rate and rhythm in real time.  Holter monitor. This is a portable device that records your heartbeat and can help diagnose heart arrhythmias. It allows your health care provider to track your heart activity for several days, if needed.  Stress tests by exercise or by giving medicine that makes the heart beat faster. TREATMENT  Treatment of palpitations depends on the cause of your symptoms and can vary greatly. Most cases of palpitations do not require  any treatment other than time, relaxation, and monitoring your symptoms. Other causes, such as atrial fibrillation, atrial flutter, or supraventricular tachycardia, usually require further treatment. HOME CARE INSTRUCTIONS   Avoid:  Caffeinated coffee, tea, soft drinks, diet pills, and energy drinks.  Chocolate.  Alcohol.  Stop smoking if you smoke.  Reduce your stress and anxiety. Things that can help you relax include:  A method of controlling things in your body, such as your heartbeats, with your mind (biofeedback).  Yoga.  Meditation.  Physical activity such as swimming, jogging, or walking.  Get plenty of rest and sleep. SEEK MEDICAL CARE IF:   You continue to have a fast or irregular heartbeat beyond 24 hours.  Your palpitations occur more often. SEEK IMMEDIATE MEDICAL CARE IF:  You have chest pain or shortness of breath.  You have a severe headache.  You feel dizzy or you faint. MAKE SURE YOU:  Understand these instructions.  Will watch your condition.  Will get help right away if you are not doing well or get worse. Document Released: 09/06/2000 Document Revised: 09/14/2013 Document Reviewed: 11/08/2011 [facility] Patient Information 2015 [place], [place]. This information is not intended to replace advice given to you by your health care provider. Make sure you discuss any questions you have with your health care provider.

## 2015-04-30 NOTE — ED Notes (Signed)
Patient reports feeling his heart race suddenly and becoming dizzy in which he feel. Denies any LOC but reports he did have some blurred vision. Per patient no blurred vision at this time but still feels "heart beating fast but not as fast as it was". Hx of SVT, was seen last week in this ER for SVT. Patient reports not taking his cardizem today and recently started metoprolol.

## 2015-04-30 NOTE — ED Provider Notes (Signed)
CSN: [id number]     Arrival date & time 04/30/15  2202 History   First MD Initiated Contact with Patient 04/30/15 1922     Chief Complaint  Patient presents with  . Tachycardia     (Consider location/radiation/quality/duration/timing/severity/associated sxs/prior Treatment) HPI Comments: Patient presents after episode of palpitations, racing heart and dizziness. States this felt similar to his previous episodes of SVT. He tried to relax and bear down. States his heart rate was 180. While walking around his job, he felt lightheaded and dizzy and got blurry vision and felt to the side. He did not completely lose consciousness. Denies chest pain or shortness of breath. Patient with similar presentations for SVT notably seen by myself 9 days ago. He isn't seen cardiology and started on Toprol 25 mg daily. He's been taking this improperly taking 12.5 mg twice daily. He denies any leg pain or leg swelling. He denies any fall from this near syncope or trauma. No headache. He feels like he is having a sore throat and congestion. He did not take any Cardizem today.  The history is provided by the patient.    Past Medical History  Diagnosis Date  . Migraines   . SVT (supraventricular tachycardia)    History reviewed. No pertinent past surgical history. Family History  Problem Relation Age of Onset  . Diabetes Other   . Cancer Other   . Heart failure Other    History  Substance Use Topics  . Smoking status: Former Smoker -- 0.25 packs/day    Types: Cigarettes    Start date: 12/01/2007    Quit date: 03/23/2014  . Smokeless tobacco: Former Systems developer    Types: [place] date: 09/19/2014  . Alcohol Use: No    Review of Systems  Constitutional: Negative for fever, activity change and appetite change.  HENT: Negative for congestion and rhinorrhea.   Respiratory: Positive for chest tightness. Negative for cough and shortness of breath.   Cardiovascular: Positive for palpitations. Negative for  chest pain and leg swelling.  Gastrointestinal: Negative for nausea, vomiting and abdominal pain.  Genitourinary: Negative for dysuria, urgency and hematuria.  Musculoskeletal: Negative for myalgias and arthralgias.  Skin: Negative for rash.  Neurological: Positive for dizziness and light-headedness. Negative for weakness and headaches.  A complete 10 system review of systems was obtained and all systems are negative except as noted in the HPI and PMH.      Allergies  Review of patient's allergies indicates no known allergies.  Home Medications   Prior to Admission medications   Medication Sig Start Date End Date Taking? Authorizing Provider  ALPRAZolam [name]) 0.5 MG tablet Take 0.5 mg by mouth daily.   Yes Historical Provider, MD  diltiazem (CARDIZEM) 30 MG tablet Take 1 tablet (30 mg total) by mouth as needed. Patient taking differently: Take 30 mg by mouth daily as needed.  09/29/14  Yes Herminio Commons, MD  HYDROcodone-acetaminophen (NORCO/VICODIN) 5-325 MG per tablet Take 1 tablet by mouth daily as needed for moderate pain. 04/27/15  Yes Luke K Kilroy, PA-C  ibuprofen (ADVIL,MOTRIN) 800 MG tablet Take 800 mg by mouth every 8 (eight) hours as needed for mild pain or moderate pain.    Yes Historical Provider, MD  metoprolol succinate (TOPROL XL) 25 MG 24 hr tablet Take 1 tablet (25 mg total) by mouth daily. Patient taking differently: Take 12.5 mg by mouth 2 (two) times daily.  04/27/15  Yes Herminio Commons, MD  omeprazole ([place]) 40  MG capsule Take 40 mg by mouth daily.   Yes Historical Provider, MD  Potassium 99 MG TABS Take 99 mg by mouth.   Yes Historical Provider, MD   BP 163/84 mmHg  Pulse 83  Temp(Src) 99.1 F (37.3 C) (Oral)  Resp 24  Ht 5\' 8"  (1.727 m)  Wt 235 lb (106.595 kg)  BMI 35.74 kg/m2  SpO2 99% Physical Exam  Constitutional: He is oriented to person, place, and time. He appears well-developed and well-nourished. No distress.  HENT:  Head:  Normocephalic and atraumatic.  Mouth/Throat: Oropharynx is clear and moist. No oropharyngeal exudate.  Eyes: Conjunctivae and EOM are normal. Pupils are equal, round, and reactive to light.  Neck: Normal range of motion. Neck supple.  No meningismus.  Cardiovascular: Normal rate, regular rhythm, normal heart sounds and intact distal pulses.   No murmur heard. Pulmonary/Chest: Effort normal and breath sounds normal. No respiratory distress. He exhibits no tenderness.  Abdominal: Soft. There is no tenderness. There is no rebound and no guarding.  Musculoskeletal: Normal range of motion. He exhibits no edema or tenderness.  Neurological: He is alert and oriented to person, place, and time. No cranial nerve deficit. He exhibits normal muscle tone. Coordination normal.  No ataxia on finger to nose bilaterally. No pronator drift. 5/5 strength throughout. CN 2-12 intact. Negative Romberg. Equal grip strength. Sensation intact. Gait is normal.   Skin: Skin is warm.  Psychiatric: He has a normal mood and affect. His behavior is normal.  Nursing note and vitals reviewed.   ED Course  Procedures (including critical care time) Labs Review Labs Reviewed  CBC WITH DIFFERENTIAL/PLATELET - Abnormal; Notable for the following:    WBC 17.5 (*)    Hemoglobin 17.2 (*)    Neutro Abs 13.1 (*)    Monocytes Absolute 1.6 (*)    All other components within normal limits  RAPID STREP SCREEN (NOT AT [facility])  CULTURE, GROUP A STREP  BASIC METABOLIC PANEL  D-DIMER, QUANTITATIVE (NOT AT [facility])    Imaging Review No results found.   EKG Interpretation   Date/Time:  Sunday April 30 2015 19:18:58 EDT Ventricular Rate:  84 PR Interval:  137 QRS Duration: 98 QT Interval:  349 QTC Calculation: 412 R Axis:   21 Text Interpretation:  Sinus rhythm RSR' in V1 or V2, right VCD or RVH No  significant change was found Confirmed by Wyvonnia Dusky  MD, Ammie Warrick [id number]) on  04/30/2015 7:40:16 PM      MDM   Final  diagnoses:  Palpitations  Orthostasis   Recurred episodes of SVT and palpitations. Patient in sinus rhythm on arrival.  He likely had another episode of SVT. He is instructed to take his Toprol as prescribed as it is extended release and should not be divided or crushed. Per cardiology notes he is scheduled for an EP evaluation on August 31.  Labs at baseline. Orthostatics positive.  PO and IV fluids given. EKG sinus tachycardia.  Encouraged hydration, taken toprol as prescribed, cardizem prn for SVT. followup with cardiology.    [name], MD 04/30/15 [phone]

## 2015-04-30 NOTE — ED Notes (Signed)
Pt tolerating PO fluids without issues

## 2015-05-01 LAB — CBG MONITORING, ED: GLUCOSE-CAPILLARY: 97 mg/dL (ref 65–99)

## 2015-05-04 LAB — CULTURE, GROUP A STREP: Strep A Culture: NEGATIVE

## 2015-05-07 ENCOUNTER — Emergency Department (HOSPITAL_COMMUNITY)
Admission: EM | Admit: 2015-05-07 | Discharge: 2015-05-07 | Disposition: A | Discharge status: Home / Self Care | Payer: BLUE CROSS/BLUE SHIELD | Attending: Emergency Medicine | Admitting: Emergency Medicine

## 2015-05-07 ENCOUNTER — Emergency Department (HOSPITAL_COMMUNITY): Payer: BLUE CROSS/BLUE SHIELD

## 2015-05-07 ENCOUNTER — Encounter (HOSPITAL_COMMUNITY): Payer: Self-pay | Admitting: Emergency Medicine

## 2015-05-07 DIAGNOSIS — R0789 Other chest pain: Secondary | ICD-10-CM | POA: Insufficient documentation

## 2015-05-07 DIAGNOSIS — J40 Bronchitis, not specified as acute or chronic: Secondary | ICD-10-CM

## 2015-05-07 DIAGNOSIS — R42 Dizziness and giddiness: Secondary | ICD-10-CM | POA: Diagnosis not present

## 2015-05-07 DIAGNOSIS — Z87891 Personal history of nicotine dependence: Secondary | ICD-10-CM | POA: Insufficient documentation

## 2015-05-07 DIAGNOSIS — Z8679 Personal history of other diseases of the circulatory system: Secondary | ICD-10-CM | POA: Insufficient documentation

## 2015-05-07 DIAGNOSIS — Z79899 Other long term (current) drug therapy: Secondary | ICD-10-CM | POA: Diagnosis not present

## 2015-05-07 DIAGNOSIS — G43909 Migraine, unspecified, not intractable, without status migrainosus: Secondary | ICD-10-CM | POA: Diagnosis not present

## 2015-05-07 DIAGNOSIS — R079 Chest pain, unspecified: Secondary | ICD-10-CM | POA: Diagnosis present

## 2015-05-07 NOTE — Discharge Instructions (Signed)
If you were given medicines take as directed.  If you are on coumadin or contraceptives realize their levels and effectiveness is altered by many different medicines.  If you have any reaction (rash, tongues swelling, other) to the medicines stop taking and see a physician.    If your blood pressure was elevated in the ER make sure you follow up for management with a primary doctor or return for chest pain, shortness of breath or stroke symptoms.  Please follow up as directed and return to the ER or see a physician for new or worsening symptoms.  Thank you. Filed Vitals:   05/07/15 1252 05/07/15 1302 05/07/15 1316  BP: 140/85 140/85   Pulse: 92 84 91  Temp: 98.1 F (36.7 C)    TempSrc: Oral    Resp: 18 14 22   Height: 5\' 8"  (1.727 m)    Weight: 220 lb (99.791 kg)    SpO2: 99% 99% 100%

## 2015-05-07 NOTE — ED Provider Notes (Signed)
CSN: [id number]     Arrival date & time 05/07/15  1243 History   First MD Initiated Contact with Patient 05/07/15 1316     Chief Complaint  Patient presents with  . Chest Pain     (Consider location/radiation/quality/duration/timing/severity/associated sxs/prior Treatment) HPI Comments: Patient with history of intermittent SVT currently on metoprolol, no family history of early cardiac disease known, no other cardiac history the patient presents with intermittent brief chest discomfort sharp left-sided no significant radiation lasting approximately 1-2 seconds at a time for the last 2 weeks. No exertional or diaphoretic component. Past smoker. Patient has follow-up with cardiology for likely ablation in the future. No current palpitations or chest pain. No classic blood clot risk factors. Patient has had mild productive cough recently and pain at times is worse with coughing  Patient is a 24 y.o. male presenting with chest pain. The history is provided by the patient.  Chest Pain Associated symptoms: cough   Associated symptoms: no abdominal pain, no back pain, no fever, no headache, no shortness of breath and not vomiting     Past Medical History  Diagnosis Date  . Migraines   . SVT (supraventricular tachycardia)    History reviewed. No pertinent past surgical history. Family History  Problem Relation Age of Onset  . Diabetes Other   . Cancer Other   . Heart failure Other    Social History  Substance Use Topics  . Smoking status: Former Smoker -- 0.25 packs/day    Types: Cigarettes    Start date: 12/01/2007    Quit date: 03/23/2014  . Smokeless tobacco: Former Systems developer    Types: [place] date: 09/19/2014  . Alcohol Use: No    Review of Systems  Constitutional: Negative for fever and chills.  HENT: Negative for congestion.   Eyes: Negative for visual disturbance.  Respiratory: Positive for cough. Negative for shortness of breath.   Cardiovascular: Positive for chest  pain.  Gastrointestinal: Negative for vomiting and abdominal pain.  Genitourinary: Negative for dysuria and flank pain.  Musculoskeletal: Negative for back pain, neck pain and neck stiffness.  Skin: Negative for rash.  Neurological: Positive for light-headedness. Negative for syncope and headaches.      Allergies  Review of patient's allergies indicates no known allergies.  Home Medications   Prior to Admission medications   Medication Sig Start Date End Date Taking? Authorizing Provider  ALPRAZolam [name]) 0.5 MG tablet Take 0.5 mg by mouth daily.    Historical Provider, MD  diltiazem (CARDIZEM) 30 MG tablet Take 1 tablet (30 mg total) by mouth as needed. Patient taking differently: Take 30 mg by mouth daily as needed.  09/29/14   Herminio Commons, MD  HYDROcodone-acetaminophen (NORCO/VICODIN) 5-325 MG per tablet Take 1 tablet by mouth daily as needed for moderate pain. 04/27/15   Erlene Quan, PA-C  ibuprofen (ADVIL,MOTRIN) 800 MG tablet Take 800 mg by mouth every 8 (eight) hours as needed for mild pain or moderate pain.     Historical Provider, MD  metoprolol succinate (TOPROL XL) 25 MG 24 hr tablet Take 1 tablet (25 mg total) by mouth daily. Patient taking differently: Take 12.5 mg by mouth 2 (two) times daily.  04/27/15   Herminio Commons, MD  omeprazole (PRILOSEC) 40 MG capsule Take 40 mg by mouth daily.    Historical Provider, MD  Potassium 99 MG TABS Take 99 mg by mouth.    Historical Provider, MD   BP 140/85 mmHg  Pulse 91  Temp(Src) 98.1 F (36.7 C) (Oral)  Resp 22  Ht 5\' 8"  (1.727 m)  Wt 220 lb (99.791 kg)  BMI 33.46 kg/m2  SpO2 100% Physical Exam  Constitutional: He is oriented to person, place, and time. He appears well-developed and well-nourished.  HENT:  Head: Normocephalic and atraumatic.  Eyes: Conjunctivae are normal. Right eye exhibits no discharge. Left eye exhibits no discharge.  Neck: Normal range of motion. Neck supple. No tracheal deviation present.   Cardiovascular: Normal rate, regular rhythm and intact distal pulses.   Pulmonary/Chest: Effort normal and breath sounds normal.  Abdominal: Soft. He exhibits no distension. There is no tenderness. There is no guarding.  Musculoskeletal: He exhibits no edema.  Neurological: He is alert and oriented to person, place, and time.  Skin: Skin is warm. No rash noted.  Psychiatric: He has a normal mood and affect.  Nursing note and vitals reviewed.   ED Course  Procedures (including critical care time) Labs Review Labs Reviewed - No data to display  Imaging Review Dg Chest 2 View  05/07/2015   CLINICAL DATA:  Chest pain for several days, cough, congestion  EXAM: CHEST  2 VIEW  COMPARISON:  04/21/2015  FINDINGS: The heart size and mediastinal contours are within normal limits. Both lungs are clear. The visualized skeletal structures are unremarkable.  IMPRESSION: No active cardiopulmonary disease.   Electronically Signed   By: Lahoma Crocker M.D.   On: 05/07/2015 13:44   I, Jessamine Barcia M, personally reviewed and evaluated these images and lab results as part of my medical decision-making.   EKG Interpretation   Date/Time:  Sunday May 07 2015 12:46:08 EDT Ventricular Rate:  92 PR Interval:  142 QRS Duration: 90 QT Interval:  344 QTC Calculation: 425 R Axis:     Text Interpretation:  Normal sinus rhythm Normal ECG Confirmed by Jamila Slatten   MD, Raydell Maners ([id number]) on 05/07/2015 1:28:21 PM      MDM   Final diagnoses:  Dizziness  Bronchitis  Other chest pain   Well-appearing patient very low risk cardiac. EKG reviewed normal-appearing. Chest x-ray reviewed by myself as well as with the patient normal-appearing. Likely musculoskeletal/bronchitis. No indication for emergent treatment or blood work. Patient has no blood clot risk factors. Follow-up outpatient discussed.  Patient is also having mild dizziness and more fatigue since starting metoprolol expected side effect.  Results and  differential diagnosis were discussed with the patient/parent/guardian. Xrays were independently reviewed by myself.  Close follow up outpatient was discussed, comfortable with the plan.   Medications - No data to display  Filed Vitals:   05/07/15 1252 05/07/15 1302 05/07/15 1316  BP: 140/85 140/85   Pulse: 92 84 91  Temp: 98.1 F (36.7 C)    TempSrc: Oral    Resp: 18 14 22   Height: 5\' 8"  (1.727 m)    Weight: 220 lb (99.791 kg)    SpO2: 99% 99% 100%    Final diagnoses:  Dizziness  Bronchitis  Other chest pain       Elnora Morrison, MD 05/07/15 1353

## 2015-05-07 NOTE — ED Notes (Signed)
Pt reports chest pain intermittent for last several days. Pt reports dry congestive cough as well. Pt reports is in the process of scheduling ablation. Pt denies any chest pain at present and reports that pain is worse with a movement and deep breath.

## 2015-05-09 ENCOUNTER — Telehealth: Payer: Self-pay | Admitting: Cardiovascular Disease

## 2015-05-09 NOTE — Telephone Encounter (Signed)
Pt called to clarify his dose of toprol and that the pharmacy told him to take it all at one time and not in divided doses

## 2015-05-09 NOTE — Telephone Encounter (Signed)
pls call the pt concerning his medications

## 2015-05-23 ENCOUNTER — Ambulatory Visit: Payer: BLUE CROSS/BLUE SHIELD | Admitting: Cardiovascular Disease

## 2015-05-24 ENCOUNTER — Encounter: Payer: Self-pay | Admitting: Internal Medicine

## 2015-05-24 ENCOUNTER — Ambulatory Visit (INDEPENDENT_AMBULATORY_CARE_PROVIDER_SITE_OTHER): Payer: BLUE CROSS/BLUE SHIELD | Admitting: Internal Medicine

## 2015-05-24 VITALS — BP 118/90 | HR 77 | Ht 68.0 in | Wt 229.4 lb

## 2015-05-24 DIAGNOSIS — F419 Anxiety disorder, unspecified: Secondary | ICD-10-CM

## 2015-05-24 DIAGNOSIS — I471 Supraventricular tachycardia: Secondary | ICD-10-CM

## 2015-05-24 NOTE — Patient Instructions (Signed)
Medication Instructions: - no changes  Labwork: - none  Procedures/Testing: - none  Follow-Up: - Dr. Lovena Le will see you back on an as needed basis.  - Please call the office at [phone] if/ when you are ready to schedule an ablation.  Any Additional Special Instructions Will Be Listed Below (If Applicable).

## 2015-05-24 NOTE — Assessment & Plan Note (Signed)
He has generalized anxiety made worse by his SVT. I have encouraged him to consider anti-anxiety medications. Also I have discussed his sinus tachycardia which is related to his anxiety and will not be fixed by catheter ablation. He understands that he may still have palpitations after ablation related to anxiety.

## 2015-05-24 NOTE — Progress Notes (Signed)
      HPI Samuel Blackwell is referred today for evaluation of SVT. He is a pleasant 24 yo man with anxiety who developed SVT at over 220/min approx. 10 months ago. He went to the ED where he was treated with IV adenosine, terminating his SVT. The patient has not had additional rapid heart racing but does note palpitations with HR's in the 120-140 range with minimal activity. He is quite anxious in general but particularly anxious with regard to his SVT. He has had lots of sinus tachycardia by history going up to 170/min. No syncope. With SVT he feels sob and palpitations. No chest pressure. No syncope. No Known Allergies   Current Outpatient Prescriptions  Medication Sig Dispense Refill  . ALPRAZolam (XANAX) 0.5 MG tablet Take 0.5 mg by mouth daily.    Marland Kitchen diltiazem (CARDIZEM) 30 MG tablet Take 1 tablet (30 mg total) by mouth as needed. (Patient taking differently: Take 30 mg by mouth daily as needed. ) 30 tablet 3  . ibuprofen (ADVIL,MOTRIN) 800 MG tablet Take 800 mg by mouth every 8 (eight) hours as needed for mild pain or moderate pain.     . metoprolol succinate (TOPROL XL) 25 MG 24 hr tablet Take 1 tablet (25 mg total) by mouth daily. (Patient taking differently: Take 12.5 mg by mouth 2 (two) times daily. ) 30 tablet 11  . omeprazole (PRILOSEC) 40 MG capsule Take 40 mg by mouth daily.    . Potassium 99 MG TABS Take 99 mg by mouth.     No current facility-administered medications for this visit.     Past Medical History  Diagnosis Date  . Migraines   . SVT (supraventricular tachycardia)     ROS:   All systems reviewed and negative except as noted in the HPI.   Past Surgical History  Procedure Laterality Date  . No past surgeries       Family History  Problem Relation Age of Onset  . Diabetes Other   . Cancer Other   . Heart failure Other      Social History   Social History  . Marital Status: Single    Spouse Name: N/A  . Number of Children: N/A  . Years of Education:  N/A   Occupational History  . Not on file.   Social History Main Topics  . Smoking status: Former Smoker -- 0.25 packs/day    Types: Cigarettes    Start date: 12/01/2007    Quit date: 03/23/2014  . Smokeless tobacco: Former Systems developer    Types: [place] date: 09/19/2014  . Alcohol Use: No  . Drug Use: No  . Sexual Activity:    Partners: Female   Other Topics Concern  . Not on file   Social History Narrative     BP 118/90 mmHg  Pulse 77  Ht 5\' 8"  (1.727 m)  Wt 229 lb 6.4 oz (104.055 kg)  BMI 34.89 kg/m2  Physical Exam:  Well appearing 24 yo man, NAD HEENT: Unremarkable Neck:  6 cm JVD, no thyromegally Back:  No CVA tenderness Lungs:  Clear with no wheezes HEART:  Regular rate rhythm, no murmurs, no rubs, no clicks Abd:  soft, positive bowel sounds, no organomegally, no rebound, no guarding Ext:  2 plus pulses, no edema, no cyanosis, no clubbing Skin:  No rashes no nodules Neuro:  CN II through XII intact, motor grossly intact  EKG - nsr with no pre-excitation  Assess/Plan:

## 2015-05-24 NOTE — Assessment & Plan Note (Signed)
He has symptoms and is anxious about the threat of more SVT. He has felt poorly with beta blocker therapy. I have discussed the risks/benefits/goals/expectations of catheter ablatio of SVT with the patient. He is considering his options and will call if he wishes to proceed with ablation.

## 2015-06-05 ENCOUNTER — Telehealth: Payer: Self-pay | Admitting: Internal Medicine

## 2015-06-05 NOTE — Telephone Encounter (Signed)
Spoke with patient. Advised Dr. Tanna Furry nurse is out of the office until Wed and she will not be calling him before Thursday at the earliest. He is looking to schedule ASAP because he has taken a leave from work and has to be back in November.     Adv I would forward the information to nurse.

## 2015-06-05 NOTE — Telephone Encounter (Signed)
New problem   Pt stated he is ready to sched is ablation. Please call t.

## 2015-06-08 NOTE — Telephone Encounter (Signed)
F/u        Pt calling to speak to [facility] and schedule ablation.

## 2015-06-10 ENCOUNTER — Emergency Department (HOSPITAL_COMMUNITY): Payer: BLUE CROSS/BLUE SHIELD

## 2015-06-10 ENCOUNTER — Emergency Department (HOSPITAL_COMMUNITY)
Admission: EM | Admit: 2015-06-10 | Discharge: 2015-06-10 | Disposition: A | Discharge status: Home / Self Care | Payer: BLUE CROSS/BLUE SHIELD | Attending: Emergency Medicine | Admitting: Emergency Medicine

## 2015-06-10 ENCOUNTER — Encounter: Payer: Self-pay | Admitting: *Deleted

## 2015-06-10 ENCOUNTER — Encounter (HOSPITAL_COMMUNITY): Payer: Self-pay | Admitting: Emergency Medicine

## 2015-06-10 ENCOUNTER — Other Ambulatory Visit: Payer: Self-pay | Admitting: *Deleted

## 2015-06-10 DIAGNOSIS — Z79899 Other long term (current) drug therapy: Secondary | ICD-10-CM | POA: Diagnosis not present

## 2015-06-10 DIAGNOSIS — R079 Chest pain, unspecified: Secondary | ICD-10-CM | POA: Diagnosis present

## 2015-06-10 DIAGNOSIS — F419 Anxiety disorder, unspecified: Secondary | ICD-10-CM | POA: Diagnosis not present

## 2015-06-10 DIAGNOSIS — Z8679 Personal history of other diseases of the circulatory system: Secondary | ICD-10-CM | POA: Insufficient documentation

## 2015-06-10 DIAGNOSIS — Z87891 Personal history of nicotine dependence: Secondary | ICD-10-CM | POA: Diagnosis not present

## 2015-06-10 DIAGNOSIS — R0789 Other chest pain: Secondary | ICD-10-CM

## 2015-06-10 DIAGNOSIS — I471 Supraventricular tachycardia: Secondary | ICD-10-CM

## 2015-06-10 HISTORY — DX: Anxiety disorder, unspecified: F41.9

## 2015-06-10 HISTORY — DX: Tachycardia, unspecified: R00.0

## 2015-06-10 LAB — I-STAT CHEM 8, ED
BUN: 10 mg/dL (ref 6–20)
CHLORIDE: 102 mmol/L (ref 101–111)
CREATININE: 1 mg/dL (ref 0.61–1.24)
Calcium, Ion: 1.15 mmol/L (ref 1.12–1.23)
Glucose, Bld: 99 mg/dL (ref 65–99)
HEMATOCRIT: 51 % (ref 39.0–52.0)
Hemoglobin: 17.3 g/dL — ABNORMAL HIGH (ref 13.0–17.0)
POTASSIUM: 4.4 mmol/L (ref 3.5–5.1)
Sodium: 139 mmol/L (ref 135–145)
TCO2: 25 mmol/L (ref 0–100)

## 2015-06-10 LAB — I-STAT TROPONIN, ED: Troponin i, poc: 0 ng/mL (ref 0.00–0.08)

## 2015-06-10 MED ORDER — GI COCKTAIL ~~LOC~~
30.0000 mL | Freq: Once | ORAL | Status: AC
Start: 2015-06-10 — End: 2015-06-10
  Administered 2015-06-10: 30 mL via ORAL
  Filled 2015-06-10: qty 30

## 2015-06-10 NOTE — ED Notes (Signed)
Pt reports intermittent chest pain that started at 1000 this morning. Pt says when the pain comes on it takes his breath away and is a tight feeling in his mid chest. This has occurred 6 or 7 times since 1000 this morning. Pt reports hx of SVT with scheduled ablation for 06/28/15. Pt takes Metoprolol daily.

## 2015-06-10 NOTE — ED Notes (Signed)
MD at bedside. 

## 2015-06-10 NOTE — ED Provider Notes (Signed)
CSN: [id number]     Arrival date & time 06/10/15  1716 History   First MD Initiated Contact with Patient 06/10/15 1745     Chief Complaint  Patient presents with  . Chest Pain      HPI Pt was seen at 1745. Per pt, c/o gradual onset and persistence of multiple intermittent episodes of chest "pain" that began this morning. Pt describes the pain as located in his upper mid-chest, "sharp," and lasting for only a few seconds each episode. Pt states the pain occurred after he ate. Denies palpitations, no SOB/cough, no abd pain, no N/V/D, no back pain, no fevers, no rash.    Past Medical History  Diagnosis Date  . Migraines   . SVT (supraventricular tachycardia)   . Anxiety   . Sinus tachycardia     r/t anxiety   Past Surgical History  Procedure Laterality Date  . No past surgeries     Family History  Problem Relation Age of Onset  . Diabetes Other   . Cancer Other   . Heart failure Other    Social History  Substance Use Topics  . Smoking status: Former Smoker -- 0.25 packs/day    Types: Cigarettes    Start date: 12/01/2007    Quit date: 03/23/2014  . Smokeless tobacco: Former Systems developer    Types: [place] date: 09/19/2014  . Alcohol Use: No    Review of Systems ROS: Statement: All systems negative except as marked or noted in the HPI; Constitutional: Negative for fever and chills. ; ; Eyes: Negative for eye pain, redness and discharge. ; ; ENMT: Negative for ear pain, hoarseness, nasal congestion, sinus pressure and sore throat. ; ; Cardiovascular: Negative for palpitations, diaphoresis, dyspnea and peripheral edema. ; ; Respiratory: Negative for cough, wheezing and stridor. ; ; Gastrointestinal: Negative for nausea, vomiting, diarrhea, abdominal pain, blood in stool, hematemesis, jaundice and rectal bleeding. . ; ; Genitourinary: Negative for dysuria, flank pain and hematuria. ; ; Musculoskeletal: +CP. Negative for back pain and neck pain. Negative for swelling and trauma.; ;  Skin: Negative for pruritus, rash, abrasions, blisters, bruising and skin lesion.; ; Neuro: Negative for headache, lightheadedness and neck stiffness. Negative for weakness, altered level of consciousness , altered mental status, extremity weakness, paresthesias, involuntary movement, seizure and syncope.      Allergies  Review of patient's allergies indicates no known allergies.  Home Medications   Prior to Admission medications   Medication Sig Start Date End Date Taking? Authorizing Provider  ALPRAZolam [name]) 0.5 MG tablet Take 0.5 mg by mouth daily.   Yes Historical Provider, MD  diltiazem (CARDIZEM) 30 MG tablet Take 1 tablet (30 mg total) by mouth as needed. 09/29/14  Yes Herminio Commons, MD  ibuprofen (ADVIL,MOTRIN) 800 MG tablet Take 800 mg by mouth every 8 (eight) hours as needed for mild pain or moderate pain.    Yes Historical Provider, MD  metoprolol succinate (TOPROL XL) 25 MG 24 hr tablet Take 1 tablet (25 mg total) by mouth daily. 04/27/15  Yes Herminio Commons, MD  omeprazole (PRILOSEC) 40 MG capsule Take 40 mg by mouth daily.   Yes Historical Provider, MD  Potassium 99 MG TABS Take 99 mg by mouth daily.    Yes Historical Provider, MD   BP 131/84 mmHg  Pulse 81  Temp(Src) 97.9 F (36.6 C) (Oral)  Resp 15  Ht 5\' 8"  (1.727 m)  Wt 220 lb (99.791 kg)  BMI 33.46 kg/m2  SpO2 99% Physical Exam  1750: Physical examination:  Nursing notes reviewed; Vital signs and O2 SAT reviewed;  Constitutional: Well developed, Well nourished, Well hydrated, In no acute distress; Head:  Normocephalic, atraumatic; Eyes: EOMI, PERRL, No scleral icterus; ENMT: Mouth and pharynx normal, Mucous membranes moist; Neck: Supple, Full range of motion, No lymphadenopathy; Cardiovascular: Regular rate and rhythm, No murmur, rub, or gallop; Respiratory: Breath sounds clear & equal bilaterally, No rales, rhonchi, wheezes.  Speaking full sentences with ease, Normal respiratory effort/excursion; Chest:  Nontender, Movement normal; Abdomen: Soft, Nontender, Nondistended, Normal bowel sounds; Genitourinary: No CVA tenderness; Extremities: Pulses normal, No tenderness, No edema, No calf edema or asymmetry.; Neuro: AA&Ox3, Major CN grossly intact.  Speech clear. No gross focal motor or sensory deficits in extremities.; Skin: Color normal, Warm, Dry.; Psych:  Very anxious.    ED Course  Procedures (including critical care time) Labs Review   Imaging Review  I have personally reviewed and evaluated these images and lab results as part of my medical decision-making.   EKG Interpretation None      MDM  MDM Reviewed: previous chart, nursing note and vitals Reviewed previous: labs and ECG Interpretation: labs, ECG and x-ray     ED ECG REPORT   Date: 06/10/2015  Rate: 76  Rhythm: normal sinus rhythm  QRS Axis: normal  Intervals: normal  ST/T Wave abnormalities: normal  Conduction Disutrbances:none  Narrative Interpretation:   Old EKG Reviewed: unchanged; no significant changes from previous EKG dated 05/24/2015.  I have personally reviewed the EKG tracing and agree with the computerized printout as noted.   Results for orders placed or performed during the hospital encounter of 06/10/15  I-stat Chem 8, ED  Result Value Ref Range   Sodium 139 135 - 145 mmol/L   Potassium 4.4 3.5 - 5.1 mmol/L   Chloride 102 101 - 111 mmol/L   BUN 10 6 - 20 mg/dL   Creatinine, Ser 1.00 0.61 - 1.24 mg/dL   Glucose, Bld 99 65 - 99 mg/dL   Calcium, Ion 1.15 1.12 - 1.23 mmol/L   TCO2 25 0 - 100 mmol/L   Hemoglobin 17.3 (H) 13.0 - 17.0 g/dL   HCT 51.0 39.0 - 52.0 %  I-stat troponin, ED  Result Value Ref Range   Troponin i, poc 0.00 0.00 - 0.08 ng/mL   Comment 3           Dg Chest 2 View 06/10/2015   CLINICAL DATA:  24 year old male with chest pain  EXAM: CHEST  2 VIEW  COMPARISON:  Radiograph 05/07/2015  FINDINGS: The heart size and mediastinal contours are within normal limits. Both lungs are  clear. The visualized skeletal structures are unremarkable.  IMPRESSION: No active cardiopulmonary disease.   Electronically Signed   By: Anner Crete M.D.   On: 06/10/2015 18:40    1930:  Doubt PE as cause for symptoms with low risk [place].  Doubt ACS as cause for symptoms with normal troponin and unchanged EKG from previous with atypical symptoms, TIMI 0/Heart score 0. Pt significantly anxious. Pt given GI cocktail with improvement. Tx symptomatically at this time, f/u GI MD and Cards MD. Dx and testing d/w pt and family.  Questions answered.  Verb understanding, agreeable to d/c home with outpt f/u.      Francine Graven, DO 06/14/15 1312

## 2015-06-10 NOTE — Telephone Encounter (Signed)
Ablation= 06/28/15 at 8:30am Labs 06/21/15 at 10:00am

## 2015-06-10 NOTE — Discharge Instructions (Signed)
°Emergency Department Resource Guide °1) Find a Doctor and Pay Out of Pocket °Although you won't have to find out who is covered by your insurance plan, it is a good idea to ask around and get recommendations. You will then need to call the office and see if the doctor you have chosen will accept you as a new patient and what types of options they offer for patients who are self-pay. Some doctors offer discounts or will set up payment plans for their patients who do not have insurance, but you will need to ask so you aren't surprised when you get to your appointment. ° °2) Contact Your Local Health Department °Not all health departments have doctors that can see patients for sick visits, but many do, so it is worth a call to see if yours does. If you don't know where your local health department is, you can check in your phone book. The CDC also has a tool to help you locate your state's health department, and many state websites also have listings of all of their local health departments. ° °3) Find a Walk-in Clinic °If your illness is not likely to be very severe or complicated, you may want to try a walk in clinic. These are popping up all over the country in pharmacies, drugstores, and shopping centers. They're usually staffed by nurse practitioners or physician assistants that have been trained to treat common illnesses and complaints. They're usually fairly quick and inexpensive. However, if you have serious medical issues or chronic medical problems, these are probably not your best option. ° °No Primary Care Doctor: °- Call [facility] at  832-8000 - they can help you locate a primary care doctor that  accepts your insurance, provides certain services, etc. °- Physician Referral Service- 1-800-533-3463 ° °Chronic Pain Problems: °Organization         Address  Phone   Notes  °[facility]  (336) [phone] Patients need to be referred by their primary care doctor.  ° °Medication  Assistance: °Organization         Address  Phone   Notes  °[place] [facility] [address] °[place], [place] [address] (336) 641-8030 --Must be a resident of [place] °-- Must have NO insurance coverage whatsoever (no Medicaid/ Medicare, etc.) °-- The pt. MUST have a primary care doctor that directs their care regularly and follows them in the community °  °[facility]  (866) 331-1348   °[employer]  (888) 892-1162   ° °Agencies that provide inexpensive medical care: °Organization         Address  Phone   Notes  °[facility]  (336) 832-8035   °[facility]    (336) 832-7272   °[facility] [address] °[place],  [address] (336) 832-4777   °[facility] [address], °[place] (336) 271-4999   °[facility]    (336) 373-0678   °[facility]    (336) 272-1050   °[facility] ° [address], [place] Phone:  (336) 832-4444, Fax:  (336) 832-4440 Hours of Operation:  9 am - 6 pm, M-F.  Also accepts Medicaid/Medicare and self-pay.  °[facility] ° [address],  Phone: (336) 832-3150, Fax: (336) [phone]. Hours of Operation:  8:30 am - 5:30 pm, M-F.  Also accepts Medicaid and self-pay.  °[facility] [place] [address], [place] Phone: (336) 878-6027   °[facility] [address], [place], [place] (336)723-1848, Ext. 123 Mondays & Thursdays: 7-9 AM.  First 15 patients are seen on a first come, first serve basis. °  ° °Medicaid-accepting [place] Providers: ° °Organization         Address  Phone   Notes  °[facility] [address], [place] (336) 641-2100 Also accepts self-pay patients.  °[facility] [address], [place] ° (336) 856-9996   °[facility] [address], [place]  (336) 288-8857   °[facility] [address] [place] [address], [place] (336) 299-7000   °Ve[facility] B[facility] [address], [place]  ° (336) 373-1557 Only accepts [place] Access Medicaid patients after they have their name applied to their card.  ° °Self-Pay (no insurance) in [place]: ° °Organization         Address  Phone   Notes  °Sickle Cell Patients, [facility] [address], [place] (336) 832-1970   °[facility] [address], [place] (336) 832-4400   °[facility] ° [address], [place] (336) 992-4800   °[facility]/Dr. Osei-Bonsu ° [address] [place] [address], [place] or [address], [place] (336) [phone] Phone number for both [place] and [place] locations is the same.  °[facility] [address], [place] (336) 299-0000   °[facility] [address] [place] [address], [place] or [address] (336) 852-7530 °(336) 878-2260   °[facility] [address], [place] (336) 350-1642, phone; (336) 294-5005, fax Sees patients 1st and 3rd Saturday of every month.  Must not qualify for public or private insurance (i.e. Medicaid, Medicare, [place] Health Choice, Veterans' Benefits) • Household income should be no more than 200% of the poverty level •The clinic cannot treat you if you are pregnant or think you are pregnant • Sexually transmitted diseases are not treated at the clinic.  ° ° °Dental Care: °Organization         Address  Phone  Notes  °[place] [facility] [address], [place] (336) 641-6152 Accepts children up to age 21 who are enrolled in Medicaid or [place] Health Choice; pregnant women with a Medicaid card; and children who have applied for Medicaid or [place] Health Choice, but were declined, whose parents can pay a reduced fee at time of service.  °[place]  [facility] [place]  [address], [place] (336) 641-7733 Accepts children up to age 21 who are enrolled in Medicaid or [place] Health Choice; pregnant women with a Medicaid card; and children who have applied for Medicaid or  Health Choice, but were declined, whose parents can pay a reduced fee at time of service.  °[facility] ° [address], [place] (336) 641-4533 Patients are seen by appointment only. Walk-ins are not accepted. [facility] will see patients 18 years of age and older. °Monday - Tuesday (8am-5pm) °Most Wednesdays (8:30-5pm) °$30 per visit, cash only  °[facility] ° [address], [place] (336) 641-4533 Patients are seen by appointment only. Walk-ins are not accepted. [facility] will see patients 18 years of age and older. °One   Wednesday Evening (Monthly: Volunteer Based).  $30 per visit, cash only  °[facility]  (919) 537-3737 for adults; Children under age 4, call Graduate Pediatric Dentistry at ([phone]) [phone]. Children aged 4-14, please call ([phone]) [phone] to request a pediatric application. ° Dental services are provided in all areas of dental care including fillings, crowns and bridges, complete and partial dentures, implants, gum treatment, root canals, and extractions. Preventive care is also provided. Treatment is provided to both adults and children. °Patients are selected via a lottery and there is often a waiting list. °  °[facility] [address], °[place] ° (336) 763-8833 [email or web address] °  °[facility] [address], [place], [place] (336)723-1848, Ext. 123 Second and Fourth Thursday of each month, opens at 6:30 AM; Clinic ends at 9 AM.  Patients are seen on a first-come first-served basis, and a limited number are seen during each clinic.  ° °[facility] ° [address], [place], [place] (336) [phone]    Eligibility Requirements °You must have lived in [place], or [place] counties for at least the last three months. °  You cannot be eligible for state or federal sponsored healthcare insurance, including Veterans Administration, Medicaid, or Medicare. °  You generally cannot be eligible for healthcare insurance through your employer.  °  How to apply: °Eligibility screenings are held every Tuesday and Wednesday afternoon from 1:00 pm until 4:00 pm. You do not need an appointment for the interview!  °[facility] [address], [place], [place] 336-631-2330   °[facility]  336-342-8273   °[facility]  336-703-3100   °[facility]  336-570-6415   ° °Behavioral Health Resources in the Community: °Intensive Outpatient Programs °Organization         Address  Phone  Notes  °[facility] [address], [place], [place] 336-878-6098   °[facility] [address], [place], [place] 336-832-9800   °[facility] [address], [place], [place] ° 336-882-2125   °[facility] [address],  °[place], [place] 1-800-853-5163 or 336-641-4981   °Substance Abuse Resources °Organization         Address  Phone  Notes  °[facility]  336-882-2125   °[facility]  336-784-9470   °[facility]  336-285-9073   °[facility]  336-845-3988   °Residential & Outpatient Substance Abuse Program  1-800-659-3381   °Psychological Services °Organization         Address  Phone  Notes  °[facility]  336- 832-9600   °[facility]  336- 378-7881   °[facility] [address], [place] 1-800-853-5163 or 336-641-4981   ° °Mobile Crisis Teams °Organization         Address  Phone  Notes  °[facility], [place] Crisis Care Unit  1-877-626-1772   °Assertive °[facility] ° [address]  [place], [place] 336-834-9664   °Sharon DeEsch [address] °[place] 336-554-5454   ° °Self-Help/Support Groups °Organization         Address  Phone             Notes  °[facility] - variety of support groups  336- 373-1402 Call for more information  °Narcotics Anonymous (NA), [facility] [address], °[place]  2 meetings at this location  ° °  Residential Treatment Programs Organization         Address  Phone  Notes  [facility] [address]    [place]  1-315-021-4034   [facility]  [address], [place] [address], [place], [place]   [place], [place] (929)136-2690 Admissions: 8am-3pm M-F  [facility] [address],    [place], [place] 297-989-2119   [facility] [address] [place], [place], [place]   [facility] [address],  [place], [place]   [facility] Intensive Outpatient [address], [place], [place]   [facility] [place]  [place], [place] 1-(201)196-6073 or (579)801-4224   [facility] [address], [place], [place] Accepts Medicaid  [facility] [address]  [place] 1-704-502-0472 Substance Abuse/Addiction Treatment   [facility]         Address  Phone  Notes  [facility]  (629)583-5586   Domenic Schwab, PhD [address] [place], [place]   234-601-0039 or 519-527-3232   [facility] [place], [place] 8784753965   [facility] [address] [place], [place] 802-823-2291 Insurance/Medicaid/sponsorship through [facility] Families [address], [address] [place] [address]                                    [place], [place] (925)542-4887 [place] [address], [place] (920)[phone]    Dr. Adele Schilder  [phone]   [facility] Dept. 1) [address], [place] 2) [place] 3)  [place] [address], [place] [phone]  5150834512   [place] 762-685-6661 or 325-739-7136 (After Hours)      Eat a bland diet, avoiding greasy, fatty, fried foods, as well as spicy and acidic foods or beverages.  Avoid eating within the hour or 2 before going to bed or laying down.  Also avoid teas, colas, coffee, chocolate, pepermint and spearment.  Take over the counter pepcid, one tablet by mouth twice a day, for the next 2 to 3 weeks.  May also take over the counter maalox/mylanta, as directed on packaging, as needed for discomfort.  Apply moist heat or ice to the area(s) of discomfort, for 15 minutes at a time, several times per day for the next few days.  Do not fall asleep on a heating or ice pack. Take your usual prescriptions as previously directed.  Call your regular medical doctor, your Cardiologist, and the GI doctor on Monday to schedule a follow up appointment this week.  Return to the Emergency Department immediately if worsening.

## 2015-06-10 NOTE — ED Notes (Signed)
PT c/o intermittent chest pain central with SOB and tightness to chest this am. PT states takes metoprolol for hx of SVT and scheduled for ablation 06/28/15 at [facility] with Dr. Lovena Le.

## 2015-06-12 ENCOUNTER — Telehealth: Payer: Self-pay

## 2015-06-12 NOTE — Telephone Encounter (Signed)
Pt was seen in the ER and they told him to follow up with [place] ASAP. He is also going to see the heart doctor in Oct, Please advise

## 2015-06-12 NOTE — Telephone Encounter (Signed)
Pt has an appointment on 06/30/15 @ 9:00. He is aware

## 2015-06-12 NOTE — Telephone Encounter (Signed)
Routine new patient appt. Not urgent.

## 2015-06-14 ENCOUNTER — Encounter: Payer: Self-pay | Admitting: Physician Assistant

## 2015-06-14 ENCOUNTER — Ambulatory Visit (INDEPENDENT_AMBULATORY_CARE_PROVIDER_SITE_OTHER): Payer: BLUE CROSS/BLUE SHIELD | Admitting: Physician Assistant

## 2015-06-14 VITALS — BP 128/78 | HR 91 | Ht 70.0 in | Wt 227.8 lb

## 2015-06-14 DIAGNOSIS — I471 Supraventricular tachycardia, unspecified: Secondary | ICD-10-CM

## 2015-06-14 DIAGNOSIS — R0789 Other chest pain: Secondary | ICD-10-CM

## 2015-06-14 DIAGNOSIS — K219 Gastro-esophageal reflux disease without esophagitis: Secondary | ICD-10-CM | POA: Diagnosis not present

## 2015-06-14 DIAGNOSIS — R079 Chest pain, unspecified: Secondary | ICD-10-CM | POA: Insufficient documentation

## 2015-06-14 NOTE — Patient Instructions (Signed)
Your physician recommends that you schedule a follow-up appointment in: To be determined  Your physician recommends that you continue on your current medications as directed. Please refer to the Current Medication list given to you today.  Thank you for choosing [facility]!

## 2015-06-14 NOTE — Assessment & Plan Note (Signed)
Patient was in the emergency room with sharp shooting chest pain that was fleeting. Cardiac enzymes were negative and EKG was unchanged. Symptoms were relieved with a GI cocktail. He has had no further chest pain. He is scheduled for ablation on 06/28/15 with Dr. Crissie Sickles. Proceed with this appointment.

## 2015-06-14 NOTE — Assessment & Plan Note (Signed)
No recurrent SVT since he's been taken Toprol. Ablation scheduled for 06/28/15

## 2015-06-14 NOTE — Assessment & Plan Note (Signed)
Patient's reflux seems to have gotten worse since taking Toprol. I told him to take with food to see if that helps.

## 2015-06-14 NOTE — Progress Notes (Signed)
Cardiology Office Note   Date:  06/14/2015   ID:  Samuel Blackwell, DOB 1991/02/21, MRN [id number]  PCP:  Tonye Becket  Cardiologist:  Dr. Crissie Sickles  Chief Complaint: Chest pain    History of Present Illness: Samuel Blackwell is a 24 y.o. male who presents for follow-up on emergency room visit for chest pain. This patient has history of anxiety and SVT at over 220 bpm. He is scheduled for ablation by Dr. Lovena Le 06/28/15.  Patient went to the ED on 06/10/15 with chest pain. He describes it as sharp shooting that comes and goes. It occurred over 2 day period. It only happened if he would sit back in a recliner lay down. It got better when he walked around her lean forward. He has had a lot of trouble with GE reflux over the years and thought it may have been because he had eaten a lot of spaghetti. His symptoms improved with GI cocktail in the ER. EKG was unchanged and cardiac enzymes are negative. Chest x-ray was normal.  Patient has had no further chest pain since emergency room visit. He denies any heavy lifting or straining. He is quite anxious and wants to make sure everything is okay before he has his ablation done. He has an appointment with gastroenterology October 7. Patient thinks his reflexes gotten worse since he's been taking Toprol. He is taking it on an empty stomach. Recommend trying to take it after he eats.    Past Medical History  Diagnosis Date  . Migraines   . SVT (supraventricular tachycardia)   . Anxiety   . Sinus tachycardia     r/t anxiety    Past Surgical History  Procedure Laterality Date  . No past surgeries       Current Outpatient Prescriptions  Medication Sig Dispense Refill  . ALPRAZolam (XANAX) 0.5 MG tablet Take 0.5 mg by mouth daily. Taking 11/2 pills daily    . diltiazem (CARDIZEM) 30 MG tablet Take 1 tablet (30 mg total) by mouth as needed. 30 tablet 3  . ibuprofen (ADVIL,MOTRIN) 800 MG tablet Take 800 mg by mouth every 8 (eight) hours as  needed for mild pain or moderate pain.     . metoprolol succinate (TOPROL XL) 25 MG 24 hr tablet Take 1 tablet (25 mg total) by mouth daily. 30 tablet 11  . omeprazole (PRILOSEC) 40 MG capsule Take 40 mg by mouth daily.    . Potassium 99 MG TABS Take 99 mg by mouth daily.      No current facility-administered medications for this visit.    Allergies:   Review of patient's allergies indicates no known allergies.    Social History:  The patient  reports that he quit smoking about 14 months ago. His smoking use included Cigarettes. He started smoking about 7 years ago. He smoked 0.25 packs per day. He quit smokeless tobacco use about 8 months ago. His smokeless tobacco use included Chew. He reports that he does not drink alcohol or use illicit drugs.   Family History:  The patient's family history includes Cancer in his other; Diabetes in his other; Heart failure in his other.    ROS:  Please see the history of present illness.   Otherwise, review of systems are positive for none.   All other systems are reviewed and negative.    PHYSICAL EXAM: VS:  BP 128/78 mmHg  Pulse 91  Ht 5\' 10"  (1.778 m)  Wt 227 lb 12.8 oz (103.329  kg)  BMI 32.69 kg/m2  SpO2 98% , BMI Body mass index is 32.69 kg/(m^2). GEN: Well nourished, well developed, in no acute distress Neck: no JVD, HJR, carotid bruits, or masses Cardiac: RRR; no murmurs,gallop, rubs, thrill or heave,  Respiratory:  clear to auscultation bilaterally, normal work of breathing GI: soft, nontender, nondistended, + BS MS: no deformity or atrophy Extremities: without cyanosis, clubbing, edema, good distal pulses bilaterally.  Skin: warm and dry, no rash Neuro:  Strength and sensation are intact    EKG:  EKG is not ordered today. The ekg from emergency room reviewed and shows normal sinus rhythm without acute change.   Recent Labs: 09/19/2014: Magnesium 1.8; TSH 2.556 04/21/2015: ALT 41 04/30/2015: Platelets 278 06/10/2015: BUN 10;  Creatinine, Ser 1.00; Hemoglobin 17.3*; Potassium 4.4; Sodium 139    Lipid Panel No results found for: CHOL, TRIG, HDL, CHOLHDL, VLDL, LDLCALC, LDLDIRECT    Wt Readings from Last 3 Encounters:  06/14/15 227 lb 12.8 oz (103.329 kg)  06/10/15 220 lb (99.791 kg)  05/24/15 229 lb 6.4 oz (104.055 kg)      Other studies Reviewed: Additional studies/ records that were reviewed today include and review of the records demonstrates:    ASSESSMENT AND PLAN:  Chest pain Patient was in the emergency room with sharp shooting chest pain that was fleeting. Cardiac enzymes were negative and EKG was unchanged. Symptoms were relieved with a GI cocktail. He has had no further chest pain. He is scheduled for ablation on 06/28/15 with Dr. Crissie Sickles. Proceed with this appointment.  SVT (supraventricular tachycardia) No recurrent SVT since he's been taken Toprol. Ablation scheduled for 06/28/15  GERD (gastroesophageal reflux disease) Patient's reflux seems to have gotten worse since taking Toprol. I told him to take with food to see if that helps.    Signed, Ermalinda Barrios, PA-C  06/14/2015 1:47 PM    [facility] [place], [place], [place]  [address] Phone: [phone]; Fax: [phone]

## 2015-06-21 ENCOUNTER — Other Ambulatory Visit (INDEPENDENT_AMBULATORY_CARE_PROVIDER_SITE_OTHER): Payer: BLUE CROSS/BLUE SHIELD | Admitting: *Deleted

## 2015-06-21 DIAGNOSIS — I471 Supraventricular tachycardia: Secondary | ICD-10-CM

## 2015-06-21 LAB — CBC WITH DIFFERENTIAL/PLATELET
BASOS ABS: 0 10*3/uL (ref 0.0–0.1)
BASOS PCT: 0.5 % (ref 0.0–3.0)
EOS ABS: 0.1 10*3/uL (ref 0.0–0.7)
Eosinophils Relative: 1.5 % (ref 0.0–5.0)
HEMATOCRIT: 49.6 % (ref 39.0–52.0)
HEMOGLOBIN: 16.9 g/dL (ref 13.0–17.0)
LYMPHS PCT: 32.1 % (ref 12.0–46.0)
Lymphs Abs: 2.7 10*3/uL (ref 0.7–4.0)
MCHC: 34 g/dL (ref 30.0–36.0)
MCV: 84.7 fl (ref 78.0–100.0)
MONOS PCT: 9.6 % (ref 3.0–12.0)
Monocytes Absolute: 0.8 10*3/uL (ref 0.1–1.0)
Neutro Abs: 4.7 10*3/uL (ref 1.4–7.7)
Neutrophils Relative %: 56.3 % (ref 43.0–77.0)
Platelets: 251 10*3/uL (ref 150.0–400.0)
RBC: 5.86 Mil/uL — ABNORMAL HIGH (ref 4.22–5.81)
RDW: 13.3 % (ref 11.5–15.5)
WBC: 8.4 10*3/uL (ref 4.0–10.5)

## 2015-06-21 LAB — BASIC METABOLIC PANEL
BUN: 9 mg/dL (ref 6–23)
CHLORIDE: 103 meq/L (ref 96–112)
CO2: 31 meq/L (ref 19–32)
CREATININE: 0.84 mg/dL (ref 0.40–1.50)
Calcium: 9.8 mg/dL (ref 8.4–10.5)
GFR: 120.84 mL/min (ref >60.00)
Glucose, Bld: 95 mg/dL (ref 70–99)
Potassium: 4.3 mEq/L (ref 3.5–5.1)
SODIUM: 138 meq/L (ref 135–145)

## 2015-06-28 ENCOUNTER — Encounter (HOSPITAL_COMMUNITY)
Admission: RE | Disposition: A | Discharge status: Home / Self Care | Payer: Self-pay | Source: Ambulatory Visit | Attending: Internal Medicine

## 2015-06-28 ENCOUNTER — Ambulatory Visit (HOSPITAL_COMMUNITY)
Admission: RE | Admit: 2015-06-28 | Discharge: 2015-06-28 | Disposition: A | Discharge status: Home / Self Care | Payer: BLUE CROSS/BLUE SHIELD | Source: Ambulatory Visit | Attending: Internal Medicine | Admitting: Internal Medicine

## 2015-06-28 ENCOUNTER — Encounter (HOSPITAL_COMMUNITY): Payer: Self-pay | Admitting: Internal Medicine

## 2015-06-28 DIAGNOSIS — Z79899 Other long term (current) drug therapy: Secondary | ICD-10-CM | POA: Insufficient documentation

## 2015-06-28 DIAGNOSIS — Z87891 Personal history of nicotine dependence: Secondary | ICD-10-CM | POA: Insufficient documentation

## 2015-06-28 DIAGNOSIS — I471 Supraventricular tachycardia: Secondary | ICD-10-CM | POA: Insufficient documentation

## 2015-06-28 DIAGNOSIS — F419 Anxiety disorder, unspecified: Secondary | ICD-10-CM | POA: Insufficient documentation

## 2015-06-28 HISTORY — PX: ELECTROPHYSIOLOGIC STUDY: SHX172A

## 2015-06-28 HISTORY — PX: ABLATION OF DYSRHYTHMIC FOCUS: SHX254

## 2015-06-28 SURGERY — A-FLUTTER/A-TACH/SVT ABLATION

## 2015-06-28 MED ORDER — POTASSIUM CHLORIDE CRYS ER 10 MEQ PO TBCR
10.0000 meq | EXTENDED_RELEASE_TABLET | Freq: Every day | ORAL | Status: DC
  Administered 2015-06-28: 14:00:00 10 meq via ORAL
  Filled 2015-06-28: qty 1

## 2015-06-28 MED ORDER — FENTANYL CITRATE (PF) 100 MCG/2ML IJ SOLN
INTRAMUSCULAR | Status: AC
  Filled 2015-06-28: qty 4

## 2015-06-28 MED ORDER — METOPROLOL SUCCINATE ER 25 MG PO TB24: ORAL_TABLET | ORAL | Status: DC

## 2015-06-28 MED ORDER — ONDANSETRON HCL 4 MG/2ML IJ SOLN
4.0000 mg | Freq: Four times a day (QID) | INTRAMUSCULAR | Status: DC | PRN
  Administered 2015-06-28: 4 mg via INTRAVENOUS

## 2015-06-28 MED ORDER — MIDAZOLAM HCL 5 MG/5ML IJ SOLN
INTRAMUSCULAR | Status: DC | PRN
  Administered 2015-06-28: 2 mg via INTRAVENOUS
  Administered 2015-06-28: 1 mg via INTRAVENOUS
  Administered 2015-06-28: 2 mg via INTRAVENOUS
  Administered 2015-06-28: 1 mg via INTRAVENOUS
  Administered 2015-06-28 (×3): 2 mg via INTRAVENOUS
  Administered 2015-06-28 (×3): 1 mg via INTRAVENOUS
  Administered 2015-06-28: 2 mg via INTRAVENOUS

## 2015-06-28 MED ORDER — SODIUM CHLORIDE 0.9 % IV SOLN: 250.0000 mL | INTRAVENOUS | Status: DC | PRN

## 2015-06-28 MED ORDER — ONDANSETRON HCL 4 MG/2ML IJ SOLN
INTRAMUSCULAR | Status: AC
Start: 2015-06-28 — End: 2015-06-28
  Filled 2015-06-28: qty 2

## 2015-06-28 MED ORDER — FENTANYL CITRATE (PF) 100 MCG/2ML IJ SOLN
INTRAMUSCULAR | Status: DC | PRN
  Administered 2015-06-28: 12.5 ug via INTRAVENOUS
  Administered 2015-06-28 (×2): 25 ug via INTRAVENOUS
  Administered 2015-06-28: 12.5 ug via INTRAVENOUS
  Administered 2015-06-28 (×2): 25 ug via INTRAVENOUS
  Administered 2015-06-28: 12.5 ug via INTRAVENOUS
  Administered 2015-06-28: 25 ug via INTRAVENOUS
  Administered 2015-06-28: 12.5 ug via INTRAVENOUS
  Administered 2015-06-28: 25 ug via INTRAVENOUS
  Administered 2015-06-28 (×2): 12.5 ug via INTRAVENOUS

## 2015-06-28 MED ORDER — MIDAZOLAM HCL 5 MG/5ML IJ SOLN
INTRAMUSCULAR | Status: AC
  Filled 2015-06-28: qty 25

## 2015-06-28 MED ORDER — ACETAMINOPHEN 325 MG PO TABS: 650.0000 mg | ORAL_TABLET | ORAL | Status: DC | PRN

## 2015-06-28 MED ORDER — SODIUM CHLORIDE 0.9 % IJ SOLN: 3.0000 mL | Freq: Two times a day (BID) | INTRAMUSCULAR | Status: DC

## 2015-06-28 MED ORDER — SODIUM CHLORIDE 0.9 % IJ SOLN: 3.0000 mL | INTRAMUSCULAR | Status: DC | PRN

## 2015-06-28 MED ORDER — METOPROLOL SUCCINATE ER 25 MG PO TB24
12.5000 mg | ORAL_TABLET | Freq: Every day | ORAL | Status: DC
  Administered 2015-06-28: 14:00:00 12.5 mg via ORAL
  Filled 2015-06-28: qty 1

## 2015-06-28 MED ORDER — SODIUM CHLORIDE 0.9 % IV SOLN
INTRAVENOUS | Status: DC
  Administered 2015-06-28: 07:00:00 via INTRAVENOUS

## 2015-06-28 MED ORDER — ALPRAZOLAM 0.5 MG PO TABS
1.0000 mg | ORAL_TABLET | Freq: Every day | ORAL | Status: DC
  Administered 2015-06-28: 1 mg via ORAL
  Filled 2015-06-28: qty 2

## 2015-06-28 MED ORDER — BUPIVACAINE HCL (PF) 0.25 % IJ SOLN
INTRAMUSCULAR | Status: AC
  Filled 2015-06-28: qty 60

## 2015-06-28 MED ORDER — IBUPROFEN 200 MG PO TABS: 800.0000 mg | ORAL_TABLET | Freq: Three times a day (TID) | ORAL | Status: DC | PRN

## 2015-06-28 MED ORDER — BUPIVACAINE HCL (PF) 0.25 % IJ SOLN
INTRAMUSCULAR | Status: DC | PRN
  Administered 2015-06-28: 55 mL

## 2015-06-28 SURGICAL SUPPLY — 13 items
BAG SNAP BAND KOVER 36X36 (MISCELLANEOUS) ×2 IMPLANT
CATH CELSIUS THERMO F CV 7FR (ABLATOR) ×2 IMPLANT
CATH HEX JOSEPH 2-5-2 65CM 6F (CATHETERS) ×2 IMPLANT
CATH JOSEPHSON QUAD-ALLRED 6FR (CATHETERS) ×4 IMPLANT
PACK EP LATEX FREE (CUSTOM PROCEDURE TRAY) ×1
PACK EP LF (CUSTOM PROCEDURE TRAY) ×1 IMPLANT
PAD DEFIB LIFELINK (PAD) ×2 IMPLANT
SHEATH PINNACLE 6F 10CM (SHEATH) ×4 IMPLANT
SHEATH PINNACLE 7F 10CM (SHEATH) ×2 IMPLANT
SHEATH PINNACLE 8F 10CM (SHEATH) ×4 IMPLANT
SHIELD RADPAD SCOOP 12X17 (MISCELLANEOUS) ×2 IMPLANT
TUBING ART PRESS 72  MALE/FEM (TUBING) ×1
TUBING ART PRESS 72 MALE/FEM (TUBING) ×1 IMPLANT

## 2015-06-28 NOTE — Progress Notes (Signed)
Site area: rt groin fv sheaths X 3  Site Prior to Removal:  Level 0 Pressure Applied For:  20 minutes, sheaths removed and pressure held by  by SEddins Manual:   yes Patient Status During Pull:  stable Post Pull Site:  Level  0 Post Pull Instructions Given:  yes Post Pull Pulses Present: yes Dressing Applied:  tegaderm Bedrest begins @  1050 Comments:  IV saline locked

## 2015-06-28 NOTE — Discharge Instructions (Signed)
No driving for 3 days. No lifting over 5 lbs for 1 week. No sexual activity for 1 week. You may return to work in 1 week.  Keep procedure site clean & dry. If you notice increased pain, swelling, bleeding or pus, call/return!  You may shower, but no soaking baths/hot tubs/pools for 1 week.  ° ° °

## 2015-06-28 NOTE — H&P (Signed)
HPI [name] is referred today for evaluation of SVT. He is a pleasant 24 yo man with anxiety who developed SVT at over 220/min approx. 10 months ago. He went to the ED where he was treated with IV adenosine, terminating his SVT. The patient has not had additional rapid heart racing but does note palpitations with HR's in the 120-140 range with minimal activity. He is quite anxious in general but particularly anxious with regard to his SVT. He has had lots of sinus tachycardia by history going up to 170/min. No syncope. With SVT he feels sob and palpitations. No chest pressure. No syncope. No Known Allergies   Current Outpatient Prescriptions  Medication Sig Dispense Refill  . ALPRAZolam (XANAX) 0.5 MG tablet Take 0.5 mg by mouth daily.    Marland Kitchen diltiazem (CARDIZEM) 30 MG tablet Take 1 tablet (30 mg total) by mouth as needed. (Patient taking differently: Take 30 mg by mouth daily as needed. ) 30 tablet 3  . ibuprofen (ADVIL,MOTRIN) 800 MG tablet Take 800 mg by mouth every 8 (eight) hours as needed for mild pain or moderate pain.     . metoprolol succinate (TOPROL XL) 25 MG 24 hr tablet Take 1 tablet (25 mg total) by mouth daily. (Patient taking differently: Take 12.5 mg by mouth 2 (two) times daily. ) 30 tablet 11  . omeprazole (PRILOSEC) 40 MG capsule Take 40 mg by mouth daily.    . Potassium 99 MG TABS Take 99 mg by mouth.     No current facility-administered medications for this visit.     Past Medical History  Diagnosis Date  . Migraines   . SVT (supraventricular tachycardia)     ROS:  All systems reviewed and negative except as noted in the HPI.   Past Surgical History  Procedure Laterality Date  . No past surgeries       Family History  Problem Relation Age of Onset  . Diabetes Other   . Cancer Other   . Heart failure Other      Social History   Social History  . Marital Status:  Single    Spouse Name: N/A  . Number of Children: N/A  . Years of Education: N/A   Occupational History  . Not on file.   Social History Main Topics  . Smoking status: Former Smoker -- 0.25 packs/day    Types: Cigarettes    Start date: 12/01/2007    Quit date: 03/23/2014  . Smokeless tobacco: Former Systems developer    Types: [place] date: 09/19/2014  . Alcohol Use: No  . Drug Use: No  . Sexual Activity:    Partners: Female   Other Topics Concern  . Not on file   Social History Narrative     BP 118/90 mmHg  Pulse 77  Ht 5\' 8"  (1.727 m)  Wt 229 lb 6.4 oz (104.055 kg)  BMI 34.89 kg/m2  Physical Exam:  Well appearing 24 yo man, NAD HEENT: Unremarkable Neck: 6 cm JVD, no thyromegally Back: No CVA tenderness Lungs: Clear with no wheezes HEART: Regular rate rhythm, no murmurs, no rubs, no clicks Abd: soft, positive bowel sounds, no organomegally, no rebound, no guarding Ext: 2 plus pulses, no edema, no cyanosis, no clubbing Skin: No rashes no nodules Neuro: CN II through XII intact, motor grossly intact  EKG - nsr with no pre-excitation  Assess/Plan:            SVT (supraventricular tachycardia) - Evans Lance,  MD at 05/24/2015 1:22 PM     Status: Written Related Problem: SVT (supraventricular tachycardia)   Expand All Collapse All   He has symptoms and is anxious about the threat of more SVT. He has felt poorly with beta blocker therapy. I have discussed the risks/benefits/goals/expectations of catheter ablatio of SVT with the patient. He is considering his options and will call if he wishes to proceed with ablation.             Anxiety - Evans Lance, MD at 05/24/2015 1:24 PM     Status: Written Related Problem: Anxiety   Expand All Collapse All   He has generalized anxiety made worse by his SVT. I have encouraged him to consider anti-anxiety medications. Also I have  discussed his sinus tachycardia which is related to his anxiety and will not be fixed by catheter ablation. He understands that he may still have palpitations after ablation related to anxiety.          Mikle Bosworth.D.

## 2015-06-28 NOTE — Progress Notes (Signed)
Site area: rt IJ venous sheath Site Prior to Removal:  Level  0 Manual:   yes Patient Status During Pull:  stable Post Pull Site:  Level  0 Post Pull Instructions Given:  yes Post Pull Pulses Present: yes Dressing Applied:  Small tegaderm Bedrest begins @  Comments:

## 2015-06-28 NOTE — Interval H&P Note (Signed)
History and Physical Interval Note:  06/28/2015 7:26 AM  Samuel Blackwell  has presented today for surgery, with the diagnosis of svt  The various methods of treatment have been discussed with the patient and family. After consideration of risks, benefits and other options for treatment, the patient has consented to  Procedure(s): SVT Ablation (N/A) as a surgical intervention .  The patient's history has been reviewed, patient examined, no change in status, stable for surgery.  I have reviewed the patient's chart and labs.  Questions were answered to the patient's satisfaction.     Cristopher Peru

## 2015-06-29 ENCOUNTER — Encounter: Payer: Self-pay | Admitting: Internal Medicine

## 2015-06-30 ENCOUNTER — Ambulatory Visit: Payer: BLUE CROSS/BLUE SHIELD | Admitting: Gastroenterology

## 2015-07-08 ENCOUNTER — Encounter: Payer: Self-pay | Admitting: Internal Medicine

## 2015-07-26 ENCOUNTER — Ambulatory Visit: Payer: BLUE CROSS/BLUE SHIELD | Admitting: Gastroenterology

## 2015-07-26 ENCOUNTER — Telehealth: Payer: Self-pay | Admitting: Gastroenterology

## 2015-07-26 ENCOUNTER — Encounter: Payer: Self-pay | Admitting: Gastroenterology

## 2015-07-26 NOTE — Telephone Encounter (Signed)
PATIENT WAS A NO SHOW AND LETTER SENT  °

## 2015-08-08 ENCOUNTER — Ambulatory Visit (INDEPENDENT_AMBULATORY_CARE_PROVIDER_SITE_OTHER): Payer: BLUE CROSS/BLUE SHIELD | Admitting: Internal Medicine

## 2015-08-08 ENCOUNTER — Encounter: Payer: Self-pay | Admitting: Internal Medicine

## 2015-08-08 VITALS — BP 122/90 | HR 70 | Ht 68.0 in | Wt 237.8 lb

## 2015-08-08 DIAGNOSIS — I471 Supraventricular tachycardia: Secondary | ICD-10-CM | POA: Diagnosis not present

## 2015-08-08 DIAGNOSIS — F419 Anxiety disorder, unspecified: Secondary | ICD-10-CM

## 2015-08-08 NOTE — Assessment & Plan Note (Signed)
He has had no recurrent symptomatic SVT. He will undergo watchful waiting.

## 2015-08-08 NOTE — Patient Instructions (Signed)
Medication Instructions:  Your physician recommends that you continue on your current medications as directed. Please refer to the Current Medication list given to you today.  Labwork: None ordered  Testing/Procedures: None ordered  Follow-Up: No follow up is needed at this time with Dr. Lovena Le.  He will see you on an as needed basis.  If you need a refill on your cardiac medications before your next appointment, please call your pharmacy.  Thank you for choosing [facility]!!

## 2015-08-08 NOTE — Assessment & Plan Note (Signed)
His anxiety has improved. He is encouraged to wean himself off of his benzodiazepines, and consider additional medical therapy as needed.

## 2015-08-08 NOTE — Progress Notes (Signed)
HPI Samuel Blackwell  Today returns for ongoing  evaluation status post catheter ablation of SVT. He is a pleasant 24 yo man with anxiety who developed SVT at over 220/min approx. 12 months ago. He was treated with medications but had recurrent symptoms, and underwent catheter ablation several weeks ago. He has done well in the interim. He denies chest pain or shortness of breath. No palpitations. He is bothered by anxiety, and takes benzodiazepines.  No Known Allergies   Current Outpatient Prescriptions  Medication Sig Dispense Refill  . ALPRAZolam (XANAX) 1 MG tablet Take 1 mg by mouth 2 (two) times daily as needed for anxiety.    . ALPRAZolam (XANAX) 0.5 MG tablet Take 1-1.5 mg by mouth daily.     Marland Kitchen ibuprofen (ADVIL,MOTRIN) 800 MG tablet Take 800 mg by mouth every 8 (eight) hours as needed for mild pain or moderate pain.     . metoprolol succinate (TOPROL XL) 25 MG 24 hr tablet Take 1/2 tablet daily for 1 week then stop 30 tablet 11  . omeprazole (PRILOSEC) 40 MG capsule Take 40 mg by mouth daily.    . Potassium 99 MG TABS Take 99 mg by mouth daily.      No current facility-administered medications for this visit.     Past Medical History  Diagnosis Date  . Migraines   . SVT (supraventricular tachycardia) ([place])   . Anxiety   . Sinus tachycardia ([place])     r/t anxiety    ROS:   All systems reviewed and negative except as noted in the HPI.   Past Surgical History  Procedure Laterality Date  . Electrophysiologic study N/A 06/28/2015    Procedure: SVT Ablation;  Surgeon: Evans Lance, MD;  Location: [facility];  Service: Cardiovascular;  Laterality: N/A;  . Ablation of dysrhythmic focus  06/28/2015    SVT     Family History  Problem Relation Age of Onset  . Diabetes Other   . Cancer Other   . Heart failure Other   . COPD Mother   . Hypertension Father   . Lung cancer Maternal Grandfather   . Heart attack Paternal Grandmother   . Heart attack Paternal  Grandfather   . Autism Brother   . Autism Brother      Social History   Social History  . Marital Status: Single    Spouse Name: N/A  . Number of Children: N/A  . Years of Education: N/A   Occupational History  . Not on file.   Social History Main Topics  . Smoking status: Former Smoker -- 0.25 packs/day    Types: Cigarettes    Start date: 12/01/2007    Quit date: 03/23/2014  . Smokeless tobacco: Former Systems developer    Types: [place] date: 09/19/2014  . Alcohol Use: No  . Drug Use: No  . Sexual Activity:    Partners: Female   Other Topics Concern  . Not on file   Social History Narrative     BP 122/90 mmHg  Pulse 70  Ht 5\' 8"  (1.727 m)  Wt 237 lb 12.8 oz (107.865 kg)  BMI 36.17 kg/m2  SpO2 97%  Physical Exam:  Well appearing 24 yo man, NAD HEENT: Unremarkable, except ear loops are present in both ears  Neck:  6 cm JVD, no thyromegally Back:  No CVA tenderness Lungs:  Clear with no wheezes HEART:  Regular rate rhythm, no murmurs, no rubs, no  clicks Abd:  soft, positive bowel sounds, no organomegally, no rebound, no guarding Ext:  2 plus pulses, no edema, no cyanosis, no clubbing Skin:  No rashes no nodules Neuro:  CN II through XII intact, motor grossly intact  EKG - nsr with no pre-excitation  Assess/Plan:

## 2015-11-14 ENCOUNTER — Telehealth: Payer: Self-pay | Admitting: Internal Medicine

## 2015-11-14 NOTE — Telephone Encounter (Signed)
Mr. Huizinga left a message requesting an appointment due to high BP. I am sending this to our nursing staff for further triaging prior to scheduling appt.

## 2015-11-14 NOTE — Telephone Encounter (Signed)
Pt had eaten 6 pieces pepperoni pizza and just wanted to BP,was 157/99.he said it is normal now.I advised him to avoid high salt foods and see pcp if it continues.

## 2015-12-14 ENCOUNTER — Telehealth: Payer: Self-pay | Admitting: Adult Health

## 2015-12-14 ENCOUNTER — Ambulatory Visit (INDEPENDENT_AMBULATORY_CARE_PROVIDER_SITE_OTHER): Payer: BLUE CROSS/BLUE SHIELD | Admitting: Cardiovascular Disease

## 2015-12-14 ENCOUNTER — Encounter: Payer: Self-pay | Admitting: Cardiovascular Disease

## 2015-12-14 VITALS — BP 135/78 | HR 76 | Ht 67.0 in | Wt 242.0 lb

## 2015-12-14 DIAGNOSIS — R0789 Other chest pain: Secondary | ICD-10-CM | POA: Diagnosis not present

## 2015-12-14 DIAGNOSIS — Z9889 Other specified postprocedural states: Secondary | ICD-10-CM

## 2015-12-14 DIAGNOSIS — K219 Gastro-esophageal reflux disease without esophagitis: Secondary | ICD-10-CM

## 2015-12-14 DIAGNOSIS — Z01818 Encounter for other preprocedural examination: Secondary | ICD-10-CM

## 2015-12-14 DIAGNOSIS — I471 Supraventricular tachycardia: Secondary | ICD-10-CM | POA: Diagnosis not present

## 2015-12-14 NOTE — Patient Instructions (Addendum)
Continue all current medications. Follow up as needed  

## 2015-12-14 NOTE — Progress Notes (Signed)
Patient ID: Samuel Blackwell, male   DOB: 27-Aug-1991, 25 y.o.   MRN: [id number]      SUBJECTIVE: The patient is a 25 year old male with a history of SVT status post ablation in 2016. He has been having epigastric and retrosternal pain for 2 weeks and is scheduled to see GI. He just began taking omeprazole and symptoms are improving. He is also due to have wisdom teeth extracted and his oral surgeon recommended he be evaluated by cardiology.  He denies palpitations.   ECG performed in the office today demonstrates normal sinus rhythm with no ischemic ST segment or T-wave abnormalities, nor any arrhythmias.   Review of Systems: As per "subjective", otherwise negative.  No Known Allergies  Current Outpatient Prescriptions  Medication Sig Dispense Refill  . ALPRAZolam (XANAX) 1 MG tablet Take 1 mg by mouth 2 (two) times daily as needed for anxiety.    Marland Kitchen ibuprofen (ADVIL,MOTRIN) 800 MG tablet Take 800 mg by mouth every 8 (eight) hours as needed for mild pain or moderate pain.     Marland Kitchen omeprazole (PRILOSEC) 40 MG capsule Take 40 mg by mouth daily.    . Potassium 99 MG TABS Take 99 mg by mouth daily.      No current facility-administered medications for this visit.    Past Medical History  Diagnosis Date  . Migraines   . SVT (supraventricular tachycardia) ([place])   . Anxiety   . Sinus tachycardia ([place])     r/t anxiety    Past Surgical History  Procedure Laterality Date  . Electrophysiologic study N/A 06/28/2015    Procedure: SVT Ablation;  Surgeon: Evans Lance, MD;  Location: [facility];  Service: Cardiovascular;  Laterality: N/A;  . Ablation of dysrhythmic focus  06/28/2015    SVT    Social History   Social History  . Marital Status: Single    Spouse Name: N/A  . Number of Children: N/A  . Years of Education: N/A   Occupational History  . Not on file.   Social History Main Topics  . Smoking status: Former Smoker -- 0.25 packs/day for 6 years    Types: Cigarettes      Start date: 12/01/2007    Quit date: 03/23/2014  . Smokeless tobacco: Former Systems developer    Types: [place] date: 09/19/2014  . Alcohol Use: No  . Drug Use: No  . Sexual Activity:    Partners: Female   Other Topics Concern  . Not on file   Social History Narrative     Filed Vitals:   12/14/15 1023  BP: 135/78  Pulse: 76  Height: 5\' 7"  (1.702 m)  Weight: 242 lb (109.77 kg)    PHYSICAL EXAM General: NAD HEENT: Normal. Neck: No JVD, no thyromegaly. Lungs: Clear to auscultation bilaterally with normal respiratory effort. CV: Nondisplaced PMI.  Regular rate and rhythm, normal S1/S2, no S3/S4, no murmur. No pretibial or periankle edema.    Abdomen: Obese.  Neurologic: Alert and oriented.  Psych: Normal affect. Skin: Normal. Musculoskeletal: No gross deformities.  ECG: Most recent ECG reviewed.      ASSESSMENT AND PLAN: 1. SVT s/p ablation: Symptomatically stable without recurrence.  2. Chest discomfort: Related to GERD, with noticeable symptom improvement recently. Scheduled to see GI.  3. Preoperative clearance: He can proceed with wisdom teeth extraction and no noninvasive testing is required.  Dispo: f/u with me prn.  Kate Sable, M.D., F.A.C.C.

## 2015-12-14 NOTE — Telephone Encounter (Signed)
Patient wanted appointment with [facility] today because he stated he was having his wisdom teeth removed and wanted to talk to [facility] prior to having it done. Advised patient she didn't have anything available today and first available is next week. He asked to have nurse to return his call. / tg

## 2015-12-14 NOTE — Telephone Encounter (Signed)
Follow up   Pt wants to come in today at 2:30pm with [facility]  Pt was having chest pain he is not having any now

## 2015-12-14 NOTE — Telephone Encounter (Signed)
I spoke with with patient who states he has has had brief ,sharp,(lasting seconds) epigastric pain for the past 2 weeks which he believes is gallbladder since all his relatives had cholecystectomies in their early 20's.He made himself an apt with Laban Emperor in GI the day before his wisdom teeth are due to be extracted.He says the oral surgeon says his teeth are "connected directly" to his heart and the were going to send a release to Rr Lovena Le at the [employer]. I was then told later by terry Goins that patient made apt to see Dr Bronson Ing in the [place] office today.

## 2015-12-19 ENCOUNTER — Emergency Department (HOSPITAL_COMMUNITY)
Admission: EM | Admit: 2015-12-19 | Discharge: 2015-12-20 | Disposition: A | Discharge status: Home / Self Care | Payer: BLUE CROSS/BLUE SHIELD | Attending: Emergency Medicine | Admitting: Emergency Medicine

## 2015-12-19 ENCOUNTER — Encounter (HOSPITAL_COMMUNITY): Payer: Self-pay | Admitting: *Deleted

## 2015-12-19 DIAGNOSIS — R1310 Dysphagia, unspecified: Secondary | ICD-10-CM | POA: Diagnosis not present

## 2015-12-19 DIAGNOSIS — Z87891 Personal history of nicotine dependence: Secondary | ICD-10-CM | POA: Diagnosis not present

## 2015-12-19 DIAGNOSIS — J029 Acute pharyngitis, unspecified: Secondary | ICD-10-CM | POA: Diagnosis present

## 2015-12-19 NOTE — ED Notes (Signed)
Pt c/o sore throat x 1 week; pt states when he eats the pain feels better, but then comes back again

## 2015-12-19 NOTE — ED Provider Notes (Signed)
CSN: [id number]     Arrival date & time 12/19/15  2117 History   First MD Initiated Contact with Patient 12/19/15 2341     Chief Complaint  Patient presents with  . Sore Throat     (Consider location/radiation/quality/duration/timing/severity/associated sxs/prior Treatment) Patient is a 25 y.o. male presenting with pharyngitis. The history is provided by the patient.  Sore Throat This is a new problem. The current episode started in the past 7 days. The problem occurs constantly. The problem has been unchanged. Associated symptoms include a sore throat. Pertinent negatives include no chills or fever.   Samuel Blackwell is a 25 y.o. male who presents to the ED with throat pain that started last week. He describes the pain as feeling like something stuck in the throat and it makes it hard to breathe. Patient has hx of GERD but this is different. Patient has appointment with GI for possible esophagus spasm.   Past Medical History  Diagnosis Date  . Migraines   . SVT (supraventricular tachycardia) ([place])   . Anxiety   . Sinus tachycardia ([place])     r/t anxiety   Past Surgical History  Procedure Laterality Date  . Electrophysiologic study N/A 06/28/2015    Procedure: SVT Ablation;  Surgeon: Evans Lance, MD;  Location: [facility];  Service: Cardiovascular;  Laterality: N/A;  . Ablation of dysrhythmic focus  06/28/2015    SVT   Family History  Problem Relation Age of Onset  . Diabetes Other   . Cancer Other   . Heart failure Other   . COPD Mother   . Hypertension Father   . Lung cancer Maternal Grandfather   . Heart attack Paternal Grandmother   . Heart attack Paternal Grandfather   . Autism Brother   . Autism Brother    Social History  Substance Use Topics  . Smoking status: Former Smoker -- 0.25 packs/day for 6 years    Types: Cigarettes    Start date: 12/01/2007    Quit date: 03/23/2014  . Smokeless tobacco: Former Systems developer    Types: [place] date: 09/19/2014  .  Alcohol Use: No    Review of Systems  Constitutional: Negative for fever and chills.  HENT: Positive for sore throat.        Felling like something stuck in throat.  all other systems negative    Allergies  Review of patient's allergies indicates no known allergies.  Home Medications   Prior to Admission medications   Medication Sig Start Date End Date Taking? Authorizing Provider  ALPRAZolam [name]) 1 MG tablet Take 1 mg by mouth 2 (two) times daily as needed for anxiety.    Historical Provider, MD  ibuprofen (ADVIL,MOTRIN) 800 MG tablet Take 800 mg by mouth every 8 (eight) hours as needed for mild pain or moderate pain.     Historical Provider, MD  omeprazole (PRILOSEC) 40 MG capsule Take 40 mg by mouth daily.    Historical Provider, MD  Potassium 99 MG TABS Take 99 mg by mouth daily.     Historical Provider, MD   BP 137/78 mmHg  Pulse 87  Temp(Src) 98.2 F (36.8 C) (Oral)  Resp 16  Ht 5\' 8"  (1.727 m)  Wt 108.863 kg  BMI 36.50 kg/m2  SpO2 99% Physical Exam  Constitutional: He is oriented to person, place, and time. He appears well-developed and well-nourished. No distress.  HENT:  Head: Normocephalic and atraumatic.  Mouth/Throat: Uvula is midline, oropharynx  is clear and moist and mucous membranes are normal.  Eyes: Conjunctivae and EOM are normal.  Neck: Neck supple.  Cardiovascular: Normal rate and regular rhythm.   Pulmonary/Chest: Effort normal.  Musculoskeletal: Normal range of motion.  Lymphadenopathy:    He has no cervical adenopathy.  Neurological: He is alert and oriented to person, place, and time. No cranial nerve deficit.  Skin: Skin is warm and dry.  Psychiatric: He has a normal mood and affect. His behavior is normal.  Nursing note and vitals reviewed.   ED Course  Procedures (including critical care time) Labs Review Labs Reviewed - No data to display  Imaging Review  MDM  25 y.o. male with feeling of something being stuck in his throat  awaiting CT results. Care turned over to Dr. Leonides Schanz @ 0225 am        [facility], NP 12/20/15 [place], DO 12/20/15 0425

## 2015-12-20 ENCOUNTER — Emergency Department (HOSPITAL_COMMUNITY): Payer: BLUE CROSS/BLUE SHIELD

## 2015-12-20 MED ORDER — IOHEXOL 300 MG/ML  SOLN
75.0000 mL | Freq: Once | INTRAMUSCULAR | Status: AC | PRN
  Administered 2015-12-20: 75 mL via INTRAVENOUS

## 2015-12-20 NOTE — Discharge Instructions (Signed)
Please continue your Omeprazole as prescribed.  Please follow up with GI as scheduled.  Please return to the ED if you are unable to swallow your own saliva, liquids or foods.  Dysphagia Swallowing problems (dysphagia) occur when solids and liquids seem to stick in your throat on the way down to your stomach, or the food takes longer to get to the stomach. Other symptoms include regurgitating food, noises coming from the throat, chest discomfort with swallowing, and a feeling of fullness or the feeling of something being stuck in your throat when swallowing. When blockage in your throat is complete, it may be associated with drooling. CAUSES  Problems with swallowing may occur because of problems with the muscles. The food cannot be propelled in the usual manner into your stomach. You may have ulcers, scar tissue, or inflammation in the tube down which food travels from your mouth to your stomach (esophagus), which blocks food from passing normally into the stomach. Causes of inflammation include:  Acid reflux from your stomach into your esophagus.  Infection.  Radiation treatment for cancer.  Medicines taken without enough fluids to wash them down into your stomach. You may have nerve problems that prevent signals from being sent to the muscles of your esophagus to contract and move your food down to your stomach. Globus pharyngeus is a relatively common problem in which there is a sense of an obstruction or difficulty in swallowing, without any physical abnormalities of the swallowing passages being found. This problem usually improves over time with reassurance and testing to rule out other causes. DIAGNOSIS Dysphagia can be diagnosed and its cause can be determined by tests in which you swallow a white substance that helps illuminate the inside of your throat (contrast medium) while X-rays are taken. Sometimes a flexible telescope that is inserted down your throat (endoscopy) to look at your  esophagus and stomach is used. TREATMENT   If the dysphagia is caused by acid reflux or infection, medicines may be used.  If the dysphagia is caused by problems with your swallowing muscles, swallowing therapy may be used to help you strengthen your swallowing muscles.  If the dysphagia is caused by a blockage or mass, procedures to remove the blockage may be done. HOME CARE INSTRUCTIONS  Try to eat soft food that is easier to swallow and check your weight on a daily basis to be sure that it is not decreasing.  Be sure to drink liquids when sitting upright (not lying down). SEEK MEDICAL CARE IF:  You are losing weight because you are unable to swallow.  You are coughing when you drink liquids (aspiration).  You are coughing up partially digested food. SEEK IMMEDIATE MEDICAL CARE IF:  You are unable to swallow your own saliva .  You are having shortness of breath or a fever, or both.  You have a hoarse voice along with difficulty swallowing. MAKE SURE YOU:  Understand these instructions.  Will watch your condition.  Will get help right away if you are not doing well or get worse.   This information is not intended to replace advice given to you by your health care provider. Make sure you discuss any questions you have with your health care provider.   Document Released: 09/06/2000 Document Revised: 09/30/2014 Document Reviewed: 02/26/2013 Elsevier Interactive Patient Education [employer].

## 2015-12-27 ENCOUNTER — Encounter: Payer: Self-pay | Admitting: Gastroenterology

## 2015-12-27 ENCOUNTER — Other Ambulatory Visit: Payer: Self-pay

## 2015-12-27 ENCOUNTER — Ambulatory Visit (INDEPENDENT_AMBULATORY_CARE_PROVIDER_SITE_OTHER): Payer: BLUE CROSS/BLUE SHIELD | Admitting: Gastroenterology

## 2015-12-27 VITALS — BP 124/75 | HR 82 | Temp 97.6°F | Ht 68.0 in | Wt 237.8 lb

## 2015-12-27 DIAGNOSIS — F458 Other somatoform disorders: Secondary | ICD-10-CM

## 2015-12-27 DIAGNOSIS — R0989 Other specified symptoms and signs involving the circulatory and respiratory systems: Secondary | ICD-10-CM | POA: Insufficient documentation

## 2015-12-27 DIAGNOSIS — R198 Other specified symptoms and signs involving the digestive system and abdomen: Secondary | ICD-10-CM

## 2015-12-27 DIAGNOSIS — K219 Gastro-esophageal reflux disease without esophagitis: Secondary | ICD-10-CM

## 2015-12-27 DIAGNOSIS — R109 Unspecified abdominal pain: Secondary | ICD-10-CM | POA: Diagnosis not present

## 2015-12-27 NOTE — Patient Instructions (Signed)
Continue taking Prilosec once each morning, 30 minutes before breakfast. It is best absorbed this way.   We have scheduled you for an ultrasound of your gallbladder.  You have also been scheduled for an upper endoscopy with possible dilation with Dr. Gala Romney.

## 2015-12-27 NOTE — Assessment & Plan Note (Signed)
Vague right-sided abdominal discomfort. Patient is concerned about his gallbladder, stating multiple family members have had issues. Proceed with routine US abdomen. Does not appear to be acutely ill or have any concerning signs/symptoms. Doubt biliary etiology at this time.

## 2015-12-27 NOTE — Assessment & Plan Note (Signed)
25 year old male with history of chronic GERD, now with intermittent chest discomfort that is likely reflux exacerbation. Has been evaluated by cardiology in the past. Chronically on omeprazole, which for the most part works well for him. He declines changing PPIs currently. May have some element of esophageal spasms a well. With new onset globus sensation as well, will proceed with upper endoscopy.   Proceed with upper endoscopy +/- dilation in the near future with Dr. Gala Romney. The risks, benefits, and alternatives have been discussed in detail with patient. They have stated understanding and desire to proceed.  PROPOFOL due to chronic Xanax use Continue Prilosec for now. May need to switch to a different PPI in the future

## 2015-12-27 NOTE — Assessment & Plan Note (Signed)
In setting of chronic GERD. EGD planned.

## 2015-12-27 NOTE — Progress Notes (Signed)
Primary Care Physician:  Tonye Becket Primary Gastroenterologist:  Dr. Gala Romney   Chief Complaint  Patient presents with  . Abdominal Pain    HPI:   Samuel Blackwell is a 25 y.o. male presenting today at the request of the ED secondary to abdominal pain, chest discomfort. Has a pain in mid chest, lasts for about 2 days, intermittent every few hours. Has been going on since September 2016.  Last week or 2 has noted a globus sensation. Last 2 days much improved. Takes Omeprazole daily for at least a year. Has history of chronic severe reflux as a teenager, taking Tums historically. In last week or so, feels like right lower quadrant is more swollen than the rest of his abdomen. Not painful but "aches". Unrelated to eating. No N/V. No dysphagia. States his whole family has had their gallbladder out.   Due to globus sensation, CT neck performed in ED and was normal.   Past Medical History  Diagnosis Date  . Migraines   . SVT (supraventricular tachycardia) ([place])   . Anxiety   . Sinus tachycardia ([place])     r/t anxiety    Past Surgical History  Procedure Laterality Date  . Electrophysiologic study N/A 06/28/2015    Procedure: SVT Ablation;  Surgeon: Evans Lance, MD;  Location: [facility];  Service: Cardiovascular;  Laterality: N/A;  . Ablation of dysrhythmic focus  06/28/2015    SVT    Current Outpatient Prescriptions  Medication Sig Dispense Refill  . ALPRAZolam (XANAX) 1 MG tablet Take 1 mg by mouth 2 (two) times daily as needed for anxiety.    Marland Kitchen ibuprofen (ADVIL,MOTRIN) 800 MG tablet Take 800 mg by mouth every 8 (eight) hours as needed for mild pain or moderate pain.     Marland Kitchen omeprazole (PRILOSEC) 40 MG capsule Take 40 mg by mouth daily.    . Potassium 99 MG TABS Take 99 mg by mouth daily.      No current facility-administered medications for this visit.    Allergies as of 12/27/2015  . (No Known Allergies)    Family History  Problem Relation Age of Onset  .  Diabetes Other   . Cancer Other   . Heart failure Other   . COPD Mother   . Hypertension Father   . Lung cancer Maternal Grandfather   . Heart attack Paternal Grandmother   . Heart attack Paternal Grandfather   . Autism Brother   . Autism Brother   . Colon cancer Neg Hx   . Gallbladder disease      multiple family members    Social History   Social History  . Marital Status: Single    Spouse Name: N/A  . Number of Children: N/A  . Years of Education: N/A   Occupational History  . [employer]    Social History Main Topics  . Smoking status: Former Smoker -- 0.25 packs/day for 6 years    Types: Cigarettes    Start date: 12/01/2007    Quit date: 03/23/2014  . Smokeless tobacco: Former Systems developer    Types: [place] date: 09/19/2014  . Alcohol Use: No  . Drug Use: No  . Sexual Activity:    Partners: Female   Other Topics Concern  . Not on file   Social History Narrative    Review of Systems: Negative unless mentioned in HPI.   Physical Exam: BP 124/75 mmHg  Pulse 82  Temp(Src)  97.6 F (36.4 C) (Oral)  Ht 5\' 8"  (1.727 m)  Wt 237 lb 12.8 oz (107.865 kg)  BMI 36.17 kg/m2 General:   Alert and oriented. Pleasant and cooperative. Well-nourished and well-developed.  Head:  Normocephalic and atraumatic. Eyes:  Without icterus, sclera clear and conjunctiva pink.  Ears:  Normal auditory acuity. Nose:  No deformity, discharge,  or lesions. Mouth:  No deformity or lesions, oral mucosa pink.  Lungs:  Clear to auscultation bilaterally. No wheezes, rales, or rhonchi. No distress.  Heart:  S1, S2 present without murmurs appreciated.  Abdomen:  +BS, soft, mild TTP RUQ with inspiration and non-distended. No HSM noted. No guarding or rebound. No masses appreciated.  Rectal:  Deferred  Msk:  Symmetrical without gross deformities. Normal posture. Extremities:  Without edema. Neurologic:  Alert and  oriented x4;  grossly normal neurologically. Psych:  Alert and cooperative.  Normal mood and affect.

## 2015-12-28 ENCOUNTER — Telehealth: Payer: Self-pay | Admitting: Internal Medicine

## 2015-12-28 ENCOUNTER — Telehealth: Payer: Self-pay | Admitting: Cardiovascular Disease

## 2015-12-28 NOTE — Telephone Encounter (Signed)
Patient c/o Palpitations:  High priority if patient c/o lightheadedness and shortness of breath.  1. How long have you been having palpitations? Today at Dentist- they detected a flutter  2. Are you currently experiencing lightheadedness and shortness of breath?no  3. Have you checked your BP and heart rate? (document readings) BP- 142/103 p 113 (4/6)  4. Are you experiencing any other symptoms? No- stated that he couldn't even tell it was happening.

## 2015-12-28 NOTE — Telephone Encounter (Signed)
Patient called the [facility] office alsos and is being seen tomorrow by PA.  Note says went to have wisdom teeth removed and was unable to proceed to to abnormal EKG. Keep appointment tomorrow as Dr Lovena Le not in office until [facility] to discuss.

## 2015-12-28 NOTE — Telephone Encounter (Signed)
Pt has an appt. With Jory Sims, N.P. Tomorrow (4/7)

## 2015-12-28 NOTE — Progress Notes (Signed)
cc'ed to pcp °

## 2015-12-29 ENCOUNTER — Encounter: Payer: Self-pay | Admitting: Adult Health

## 2015-12-29 ENCOUNTER — Ambulatory Visit (INDEPENDENT_AMBULATORY_CARE_PROVIDER_SITE_OTHER): Payer: BLUE CROSS/BLUE SHIELD | Admitting: Adult Health

## 2015-12-29 VITALS — BP 140/86 | HR 133 | Ht 68.0 in | Wt 236.0 lb

## 2015-12-29 DIAGNOSIS — I4902 Ventricular flutter: Secondary | ICD-10-CM | POA: Diagnosis not present

## 2015-12-29 NOTE — Progress Notes (Deleted)
Name: Samuel Blackwell    DOB: Apr 21, 1991  Age: 25 y.o.  MR#: [id number]       PCP:  Tonye Becket      Insurance: Payor: BLUE CROSS BLUE SHIELD / Plan: BCBS OTHER / Product Type: *No Product type* /   CC:   No chief complaint on file.   VS Filed Vitals:   12/29/15 1546  BP: 140/86  Pulse: 133  Height: 5\' 8"  (1.727 m)  Weight: 236 lb (107.049 kg)  SpO2: 96%    Weights Current Weight  12/29/15 236 lb (107.049 kg)  12/27/15 237 lb 12.8 oz (107.865 kg)  12/19/15 240 lb (108.863 kg)    Blood Pressure  BP Readings from Last 3 Encounters:  12/29/15 140/86  12/27/15 124/75  12/19/15 137/78     Admit date:  (Not on file) Last encounter with R[facility]:  12/14/2015   Allergy Review of patient's allergies indicates no known allergies.  Current Outpatient Prescriptions  Medication Sig Dispense Refill  . ALPRAZolam (XANAX) 1 MG tablet Take 1 mg by mouth 2 (two) times daily as needed for anxiety.    Marland Kitchen ibuprofen (ADVIL,MOTRIN) 800 MG tablet Take 800 mg by mouth every 8 (eight) hours as needed for mild pain or moderate pain.     Marland Kitchen omeprazole (PRILOSEC) 40 MG capsule Take 40 mg by mouth daily.    . Potassium 99 MG TABS Take 99 mg by mouth daily.      No current facility-administered medications for this visit.    Discontinued Meds:   There are no discontinued medications.  Patient Active Problem List   Diagnosis Date Noted  . Abdominal pain 12/27/2015  . Globus sensation 12/27/2015  . Chest pain 06/14/2015  . GERD (gastroesophageal reflux disease) 06/14/2015  . Anxiety 05/24/2015  . SVT (supraventricular tachycardia) ([facility]) 09/29/2014    LABS    Component Value Date/Time   NA 138 06/21/2015 0956   NA 139 06/10/2015 1817   NA 139 04/30/2015 1934   K 4.3 06/21/2015 0956   K 4.4 06/10/2015 1817   K 4.1 04/30/2015 1934   CL 103 06/21/2015 0956   CL 102 06/10/2015 1817   CL 102 04/30/2015 1934   CO2 31 06/21/2015 0956   CO2 25 04/30/2015 1934   CO2 25 04/21/2015 2120    GLUCOSE 95 06/21/2015 0956   GLUCOSE 99 06/10/2015 1817   GLUCOSE 96 04/30/2015 1934   BUN 9 06/21/2015 0956   BUN 10 06/10/2015 1817   BUN 10 04/30/2015 1934   CREATININE 0.84 06/21/2015 0956   CREATININE 1.00 06/10/2015 1817   CREATININE 0.92 04/30/2015 1934   CALCIUM 9.8 06/21/2015 0956   CALCIUM 10.0 04/30/2015 1934   CALCIUM 9.2 04/21/2015 2120   GFRNONAA >60 04/30/2015 1934   GFRNONAA >60 04/21/2015 2120   GFRNONAA >90 09/19/2014 1531   GFRAA >60 04/30/2015 1934   GFRAA >60 04/21/2015 2120   GFRAA >90 09/19/2014 1531   CMP     Component Value Date/Time   NA 138 06/21/2015 0956   K 4.3 06/21/2015 0956   CL 103 06/21/2015 0956   CO2 31 06/21/2015 0956   GLUCOSE 95 06/21/2015 0956   BUN 9 06/21/2015 0956   CREATININE 0.84 06/21/2015 0956   CALCIUM 9.8 06/21/2015 0956   PROT 7.3 04/21/2015 2120   ALBUMIN 4.6 04/21/2015 2120   AST 21 04/21/2015 2120   ALT 41 04/21/2015 2120   ALKPHOS 85 04/21/2015 2120   BILITOT 1.4* 04/21/2015 2120  GFRNONAA >60 04/30/2015 1934   GFRAA >60 04/30/2015 1934       Component Value Date/Time   WBC 8.4 06/21/2015 0956   WBC 17.5* 04/30/2015 1934   WBC 13.1* 04/21/2015 2120   HGB 16.9 06/21/2015 0956   HGB 17.3* 06/10/2015 1817   HGB 17.2* 04/30/2015 1934   HCT 49.6 06/21/2015 0956   HCT 51.0 06/10/2015 1817   HCT 48.0 04/30/2015 1934   MCV 84.7 06/21/2015 0956   MCV 84.1 04/30/2015 1934   MCV 82.9 04/21/2015 2120    Lipid Panel  No results found for: CHOL, TRIG, HDL, CHOLHDL, VLDL, LDLCALC, LDLDIRECT  ABG    Component Value Date/Time   TCO2 25 06/10/2015 1817     Lab Results  Component Value Date   TSH 2.556 09/19/2014   BNP (last 3 results) No results for input(s): BNP in the last 8760 hours.  ProBNP (last 3 results) No results for input(s): PROBNP in the last 8760 hours.  Cardiac Panel (last 3 results) No results for input(s): CKTOTAL, CKMB, TROPONINI, RELINDX in the last 72 hours.  Iron/TIBC/Ferritin/  %Sat No results found for: IRON, TIBC, FERRITIN, IRONPCTSAT   EKG Orders placed or performed in visit on 12/29/15  . EKG 12-Lead     Prior Assessment and Plan Problem List as of 12/29/2015      Cardiovascular and Mediastinum   SVT (supraventricular tachycardia) [facility])   Last Assessment & Plan 08/08/2015 Office Visit Written 08/08/2015  6:08 PM by Evans Lance, MD    He has had no recurrent symptomatic SVT. He will undergo watchful waiting.        Digestive   GERD (gastroesophageal reflux disease)   Last Assessment & Plan 12/27/2015 Office Visit Written 12/27/2015 11:49 AM by Orvil Feil, NP    25 year old male with history of chronic GERD, now with intermittent chest discomfort that is likely reflux exacerbation. Has been evaluated by cardiology in the past. Chronically on omeprazole, which for the most part works well for him. He declines changing PPIs currently. May have some element of esophageal spasms a well. With new onset globus sensation as well, will proceed with upper endoscopy.   Proceed with upper endoscopy +/- dilation in the near future with Dr. Gala Romney. The risks, benefits, and alternatives have been discussed in detail with patient. They have stated understanding and desire to proceed.  PROPOFOL due to chronic Xanax use Continue Prilosec for now. May need to switch to a different PPI in the future        Other   Anxiety   Last Assessment & Plan 08/08/2015 Office Visit Written 08/08/2015  6:08 PM by Evans Lance, MD    His anxiety has improved. He is encouraged to wean himself off of his benzodiazepines, and consider additional medical therapy as needed.      Chest pain   Last Assessment & Plan 06/14/2015 Office Visit Written 06/14/2015  2:04 PM by Imogene Burn, PA-C    Patient was in the emergency room with sharp shooting chest pain that was fleeting. Cardiac enzymes were negative and EKG was unchanged. Symptoms were relieved with a GI cocktail. He has had no further  chest pain. He is scheduled for ablation on 06/28/15 with Dr. Crissie Sickles. Proceed with this appointment.      Abdominal pain   Last Assessment & Plan 12/27/2015 Office Visit Written 12/27/2015 11:50 AM by Orvil Feil, NP    Vague right-sided abdominal discomfort. Patient is concerned  about his gallbladder, stating multiple family members have had issues. Proceed with routine US abdomen. Does not appear to be acutely ill or have any concerning signs/symptoms. Doubt biliary etiology at this time.       Globus sensation   Last Assessment & Plan 12/27/2015 Office Visit Written 12/27/2015 11:50 AM by Orvil Feil, NP    In setting of chronic GERD. EGD planned.           Imaging: Ct Soft Tissue Neck W Contrast  12/20/2015  CLINICAL DATA:  Initial evaluation for 1 week history of sore throat. EXAM: CT NECK WITH CONTRAST TECHNIQUE: Multidetector CT imaging of the neck was performed using the standard protocol following the bolus administration of intravenous contrast. CONTRAST:  38mL OMNIPAQUE IOHEXOL 300 MG/ML  SOLN COMPARISON:  None. FINDINGS: Visualized portions of the brain and posterior fossa are normal. Partially visualized globes and orbits within normal limits. Paranasal sinuses are clear. No mastoid effusion. Partially visualized middle ear cavities are well pneumatized. Salivary glands including the parotid glands and submandibular glands are normal. Oral cavity within normal limits. No acute abnormality about the dentition. Palatine tonsils symmetric and within normal limits. Parapharyngeal fat preserved bilaterally. Nasopharynx within normal limits. Remainder of the oropharynx unremarkable without significant mucosal edema. No retropharyngeal fluid collection. Epiglottis normal. Vallecular within normal limits. Remainder of the hypopharynx and supraglottic larynx are normal. True cords within normal limits. Subglottic airway clear. Subcentimeter hypodense nodule noted within the right lobe of thyroid,  of doubtful clinical significance. Thyroid gland otherwise unremarkable. No pathologically enlarged lymph nodes identified within neck. Visualized superior mediastinum within normal limits. Visualized lungs are clear. Normal intravascular enhancement seen throughout the neck. No acute osseous abnormality. No worrisome lytic or blastic osseous lesions. IMPRESSION: Normal CT of the neck. No significant inflammatory changes or other abnormality identified. Electronically Signed   By: Jeannine Boga M.D.   On: 12/20/2015 02:56

## 2015-12-29 NOTE — Patient Instructions (Signed)
Medication Instructions:  Your physician recommends that you continue on your current medications as directed. Please refer to the Current Medication list given to you today.   Labwork: NONE  Testing/Procedures: NONE  Follow-Up: Your physician recommends that you schedule a follow-up appointment in: AS NEEDED      Any Other Special Instructions Will Be Listed Below (If Applicable).     If you need a refill on your cardiac medications before your next appointment, please call your pharmacy.   

## 2015-12-29 NOTE — Progress Notes (Signed)
Cardiology Office Note   Date:  12/29/2015   ID:  Samuel Blackwell, DOB 1991-07-13, MRN [id number]  PCP:  Tonye Becket  Cardiologist: Woodroe Chen, NP   Chief Complaint  Patient presents with  . Abnormal ECG      History of Present Illness: Samuel Blackwell is a 24 y.o. male who presents for ongoing assessment and management of SVT, status post ablation in 2016.  He was last seen by Dr. Bronson Ing on 12/14/2015 in [place],with complaints of epigastric and retrosternal pain for 2 weeks.  He was to follow up with GI.he was stable from a cardiac standpoint.  He was released to also proceed with wisdom tooth extraction without any further testing required.  He was to come back for when necessary followup.when he went to his dentist to have his teeth extracted, a dentist noticed that his heart rate was fluttering.  He was admitted to my schedule to reevaluate today.  He brings with him a copy of the EKG strip completed while he was in the dentist office.  It shows artifact only normal sinus rhythm.  We have completed an EKG here in the office and shows sinus rhythm, sinus tachycardia, heart rate 109 beats a minute.  The patient is very anxious and very sensitive to any changes in his body.  Past Medical History  Diagnosis Date  . Migraines   . SVT (supraventricular tachycardia) ([place])   . Anxiety   . Sinus tachycardia ([place])     r/t anxiety    Past Surgical History  Procedure Laterality Date  . Electrophysiologic study N/A 06/28/2015    Procedure: SVT Ablation;  Surgeon: Evans Lance, MD;  Location: [place] [facility];  Service: Cardiovascular;  Laterality: N/A;  . Ablation of dysrhythmic focus  06/28/2015    SVT     Current Outpatient Prescriptions  Medication Sig Dispense Refill  . ALPRAZolam (XANAX) 1 MG tablet Take 1 mg by mouth 2 (two) times daily as needed for anxiety.    Marland Kitchen ibuprofen (ADVIL,MOTRIN) 800 MG tablet Take 800 mg by mouth every 8 (eight) hours as needed  for mild pain or moderate pain.     Marland Kitchen omeprazole (PRILOSEC) 40 MG capsule Take 40 mg by mouth daily.    . Potassium 99 MG TABS Take 99 mg by mouth daily.      No current facility-administered medications for this visit.    Allergies:   Review of patient's allergies indicates no known allergies.    Social History:  The patient  reports that he quit smoking about 21 months ago. His smoking use included Cigarettes. He started smoking about 8 years ago. He has a 1.5 pack-year smoking history. He quit smokeless tobacco use about 15 months ago. His smokeless tobacco use included Chew. He reports that he does not drink alcohol or use illicit drugs.   Family History:  The patient's family history includes Autism in his brother and brother; COPD in his mother; Cancer in his other; Diabetes in his other; Heart attack in his paternal grandfather and paternal grandmother; Heart failure in his other; Hypertension in his father; Lung cancer in his maternal grandfather. There is no history of Colon cancer.    ROS: All other systems are reviewed and negative. Unless otherwise mentioned in H&P    PHYSICAL EXAM: VS:  BP 140/86 mmHg  Pulse 133  Ht 5\' 8"  (1.727 m)  Wt 236 lb (107.049 kg)  BMI 35.89 kg/m2  SpO2 96% ,  BMI Body mass index is 35.89 kg/(m^2). GEN: Well nourished, well developed, in no acute distress HEENT: normal Neck: no JVD, carotid bruits, or masses Cardiac: RRR; tachycardic,no murmurs, rubs, or gallops,no edema  Respiratory:  clear to auscultation bilaterally, normal work of breathing GI: soft, nontender, nondistended, + BS MS: no deformity or atrophy Skin: warm and dry, no rash Neuro:  Strength and sensation are intact Psych: euthymic mood, full affect   EKG: The ekg ordered today demonstrates sinus tachycardia, heart rate 109 beats per minute.   Recent Labs: 04/21/2015: ALT 41 06/21/2015: BUN 9; Creatinine, Ser 0.84; Hemoglobin 16.9; Platelets 251.0; Potassium 4.3; Sodium 138     Lipid Panel No results found for: CHOL, TRIG, HDL, CHOLHDL, VLDL, LDLCALC, LDLDIRECT    Wt Readings from Last 3 Encounters:  12/29/15 236 lb (107.049 kg)  12/27/15 237 lb 12.8 oz (107.865 kg)  12/19/15 240 lb (108.863 kg)      ASSESSMENT AND PLAN:  1.  Tachycardia: I have given him reassurance, reviewed his EKG, and explained the artifact on EKG strip.  He is brought with him.  Will not make any medication changes do any further testing.  Will see him when necessary.  2. Chronic anxiety:he is advised to followup with his primary care provider for adjustments in medication and possibly psychological counseling at their recommendation and discussion.   Current medicines are reviewed at length with the patient today.    Labs/ tests ordered today include:   Orders Placed This Encounter  Procedures  . EKG 12-Lead     Disposition:   FU with as needed  Signed, Jory Sims, NP  12/29/2015 4:08 PM    [facility] [address], [place], [place] [address] Phone: [phone]; Fax: [phone]

## 2016-01-02 ENCOUNTER — Ambulatory Visit (HOSPITAL_COMMUNITY)
Admission: RE | Admit: 2016-01-02 | Discharge: 2016-01-02 | Disposition: A | Discharge status: Home / Self Care | Payer: BLUE CROSS/BLUE SHIELD | Source: Ambulatory Visit | Attending: Gastroenterology | Admitting: Gastroenterology

## 2016-01-02 DIAGNOSIS — R109 Unspecified abdominal pain: Secondary | ICD-10-CM

## 2016-01-08 ENCOUNTER — Telehealth: Payer: Self-pay | Admitting: Internal Medicine

## 2016-01-08 ENCOUNTER — Encounter (HOSPITAL_COMMUNITY): Payer: Self-pay | Admitting: Emergency Medicine

## 2016-01-08 DIAGNOSIS — Z87891 Personal history of nicotine dependence: Secondary | ICD-10-CM | POA: Diagnosis not present

## 2016-01-08 DIAGNOSIS — D1779 Benign lipomatous neoplasm of other sites: Secondary | ICD-10-CM | POA: Insufficient documentation

## 2016-01-08 DIAGNOSIS — R109 Unspecified abdominal pain: Secondary | ICD-10-CM | POA: Diagnosis present

## 2016-01-08 LAB — URINALYSIS, ROUTINE W REFLEX MICROSCOPIC
Bilirubin Urine: NEGATIVE
GLUCOSE, UA: NEGATIVE mg/dL
Hgb urine dipstick: NEGATIVE
Ketones, ur: NEGATIVE mg/dL
LEUKOCYTES UA: NEGATIVE
NITRITE: NEGATIVE
PROTEIN: NEGATIVE mg/dL
Specific Gravity, Urine: 1.01 (ref 1.005–1.030)
pH: 5.5 (ref 5.0–8.0)

## 2016-01-08 LAB — CBC
HCT: 46.7 % (ref 39.0–52.0)
Hemoglobin: 16 g/dL (ref 13.0–17.0)
MCH: 28.3 pg (ref 26.0–34.0)
MCHC: 34.3 g/dL (ref 30.0–36.0)
MCV: 82.7 fL (ref 78.0–100.0)
PLATELETS: 254 10*3/uL (ref 150–400)
RBC: 5.65 MIL/uL (ref 4.22–5.81)
RDW: 12.8 % (ref 11.5–15.5)
WBC: 10.6 10*3/uL — AB (ref 4.0–10.5)

## 2016-01-08 LAB — COMPREHENSIVE METABOLIC PANEL
ALT: 50 U/L (ref 17–63)
AST: 23 U/L (ref 15–41)
Albumin: 4.4 g/dL (ref 3.5–5.0)
Alkaline Phosphatase: 96 U/L (ref 38–126)
Anion gap: 9 (ref 5–15)
BUN: 14 mg/dL (ref 6–20)
CHLORIDE: 104 mmol/L (ref 101–111)
CO2: 24 mmol/L (ref 22–32)
CREATININE: 0.92 mg/dL (ref 0.61–1.24)
Calcium: 9.1 mg/dL (ref 8.9–10.3)
Glucose, Bld: 118 mg/dL — ABNORMAL HIGH (ref 65–99)
Potassium: 3.7 mmol/L (ref 3.5–5.1)
Sodium: 137 mmol/L (ref 135–145)
Total Bilirubin: 0.8 mg/dL (ref 0.3–1.2)
Total Protein: 7.2 g/dL (ref 6.5–8.1)

## 2016-01-08 LAB — LIPASE, BLOOD: LIPASE: 26 U/L (ref 11–51)

## 2016-01-08 NOTE — Progress Notes (Signed)
Quick Note:  US abdomen negative. I would hold off on HIDA until after EGD. Need to see results from EGD prior to pursuing a biliary work-up. Low likelihood of biliary etiology but unable to exclude. Proceed with EGD as planned. ______

## 2016-01-08 NOTE — ED Provider Notes (Signed)
CSN: [id number]     Arrival date & time 01/08/16  2115 History  By signing my name below, I, Samuel Blackwell, attest that this documentation has been prepared under the direction and in the presence of Samuel Porter, MD at 2352 PM . Electronically Signed: Irene Blackwell, ED Scribe. 01/08/2016. 12:02 AM.  Chief Complaint  Patient presents with  . Abdominal Pain   The history is provided by the patient. No language interpreter was used.  HPI Comments: Samuel Blackwell is a 25 y.o. Male with a hx of SVT s/p ablation, kidney stones,  who presents to the Emergency Department complaining of intermittent right sided abdominal pain onset two weeks ago. Pt reports that episodes of pain will last a few minutes at a time, 10x a day. He reports associated non-painful swelling to the right abdomen that has been constant and constipated over the last week. He was able to have a BM today but it was a small amount and hard. Pt was worried that the area of swelling was a hernia. He states that he was seen by a gastroenterologist two weeks ago and had an ultrasound done on his gallbladder; he states that they did not find gall stones but he has a family hx of gall bladder disease. Pt states that he has been doing more weight lifting up to 225 pounds and does heavy lifting at work. He denies fever, chills, nausea, or vomiting. Pt is a former smoker and does not drink. He is scheduled to have a endoscopy done on May 1. He is artery seen his PCP about this nonpainful swelling in his abdomen. He was told but I told him which it just feels to be subcutaneous fat.  PCP Dr Samuel Blackwell at [facility]  Past Medical History  Diagnosis Date  . Migraines   . SVT (supraventricular tachycardia) ([place])   . Anxiety   . Sinus tachycardia ([place])     r/t anxiety   Past Surgical History  Procedure Laterality Date  . Electrophysiologic study N/A 06/28/2015    Procedure: SVT Ablation;  Surgeon: Evans Lance, MD;  Location: [facility];  Service: Cardiovascular;  Laterality: N/A;  . Ablation of dysrhythmic focus  06/28/2015    SVT   Family History  Problem Relation Age of Onset  . Diabetes Other   . Cancer Other   . Heart failure Other   . COPD Mother   . Hypertension Father   . Lung cancer Maternal Grandfather   . Heart attack Paternal Grandmother   . Heart attack Paternal Grandfather   . Autism Brother   . Autism Brother   . Colon cancer Neg Hx   . Gallbladder disease      multiple family members   Social History  Substance Use Topics  . Smoking status: Former Smoker -- 0.25 packs/day for 6 years    Types: Cigarettes    Start date: 12/01/2007    Quit date: 03/23/2014  . Smokeless tobacco: Former Systems developer    Types: [place] date: 09/19/2014  . 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  Gastrointestinal: Positive for abdominal pain and constipation. Negative for nausea and vomiting.  All other systems reviewed and are negative.  Allergies  Review of patient's allergies indicates no known allergies.  Home Medications   Prior to Admission medications   Medication Sig Start Date End  Date Taking? Authorizing Provider  ALPRAZolam [name]) 1 MG tablet Take 1 mg by mouth 2 (two) times daily as needed for anxiety.    Historical Provider, MD  ibuprofen (ADVIL,MOTRIN) 800 MG tablet Take 800 mg by mouth every 8 (eight) hours as needed for mild pain or moderate pain.     Historical Provider, MD  omeprazole (PRILOSEC) 40 MG capsule Take 40 mg by mouth daily.    Historical Provider, MD  Potassium 99 MG TABS Take 99 mg by mouth daily.     Historical Provider, MD   BP 166/90 mmHg  Pulse 91  Temp(Src) 98.3 F (36.8 C) (Oral)  Resp 18  Ht 5\' 8"  (1.727 m)  Wt 240 lb (108.863 kg)  BMI 36.50 kg/m2  SpO2 98%  Vital signs normal    Physical Exam  Constitutional: He is oriented to person, place, and time. He  appears well-developed and well-nourished.  Non-toxic appearance. He does not appear ill. No distress.  HENT:  Head: Normocephalic and atraumatic.  Right Ear: External ear normal.  Left Ear: External ear normal.  Nose: Nose normal. No mucosal edema or rhinorrhea.  Mouth/Throat: Oropharynx is clear and moist and mucous membranes are normal. No dental abscesses or uvula swelling.  Eyes: Conjunctivae and EOM are normal. Pupils are equal, round, and reactive to light.  Neck: Normal range of motion and full passive range of motion without pain. Neck supple.  Cardiovascular: Normal rate, regular rhythm and normal heart sounds.  Exam reveals no gallop and no friction rub.   No murmur heard. Pulmonary/Chest: Effort normal and breath sounds normal. No respiratory distress. He has no wheezes. He has no rhonchi. He has no rales. He exhibits no tenderness and no crepitus.  Abdominal: Soft. Normal appearance and bowel sounds are normal. He exhibits no distension. There is no tenderness. There is no rebound and no guarding.    Patient has no obvious swelling that I can see of his abdomen. When I palpate his abdomen I do not feel any subcutaneous masses or abnormality. I can feel his abdominal wall and I do not feel any obvious defect. I examine his abdominal wall when he was straining I do not feel any bulging or evidence of a hernia.  Area of his concern marked  Musculoskeletal: Normal range of motion. He exhibits no edema or tenderness.  Moves all extremities well.   Neurological: He is alert and oriented to person, place, and time. He has normal strength. No cranial nerve deficit.  Skin: Skin is warm, dry and intact. No rash noted. No erythema. No pallor.  Psychiatric: He has a normal mood and affect. His speech is normal and behavior is normal. His mood appears not anxious.  Nursing note and vitals reviewed.   ED Course  Procedures (including critical care time)  DIAGNOSTIC STUDIES: Oxygen  Saturation is 98% on RA, normal by my interpretation.    COORDINATION OF CARE: 11:58 PM-Discussed treatment plan which includes labs with pt at bedside and pt agreed to plan.    I tried to reassure patient that I do not feel any obvious indication of a hernia being present. He then relates to me his primary care doctor told him same thing. Patient is questioning whether he needs a CT scan, however a 20 CT scan is not indicated tonight. It is very rare for hernia to be nonpainful.Marland Kitchen He started working out in December and admits that although he did not lose weight he did loose body fat and  increased muscle mass. I feel he either had a asymmetrical loss of fat in his abdominal wall or he may have a fatty tumor deposit although I do not feel a distinct area of increased fatty tissue deposit. At this point however it is nonpainful. It is not getting bigger. He can follow-up with this with his primary care doctor.  Patient is already seeing a gastroenterologist about his other right upper quadrant pain. He should follow-up with them to further evaluate that.   Labs Review Results for orders placed or performed during the hospital encounter of 01/09/16  Lipase, blood  Result Value Ref Range   Lipase 26 11 - 51 U/L  Comprehensive metabolic panel  Result Value Ref Range   Sodium 137 135 - 145 mmol/L   Potassium 3.7 3.5 - 5.1 mmol/L   Chloride 104 101 - 111 mmol/L   CO2 24 22 - 32 mmol/L   Glucose, Bld 118 (H) 65 - 99 mg/dL   BUN 14 6 - 20 mg/dL   Creatinine, Ser 0.92 0.61 - 1.24 mg/dL   Calcium 9.1 8.9 - 10.3 mg/dL   Total Protein 7.2 6.5 - 8.1 g/dL   Albumin 4.4 3.5 - 5.0 g/dL   AST 23 15 - 41 U/L   ALT 50 17 - 63 U/L   Alkaline Phosphatase 96 38 - 126 U/L   Total Bilirubin 0.8 0.3 - 1.2 mg/dL   GFR calc non Af Amer >60 >60 mL/min   GFR calc Af Amer >60 >60 mL/min   Anion gap 9 5 - 15  CBC  Result Value Ref Range   WBC 10.6 (H) 4.0 - 10.5 K/uL   RBC 5.65 4.22 - 5.81 MIL/uL   Hemoglobin  16.0 13.0 - 17.0 g/dL   HCT 46.7 39.0 - 52.0 %   MCV 82.7 78.0 - 100.0 fL   MCH 28.3 26.0 - 34.0 pg   MCHC 34.3 30.0 - 36.0 g/dL   RDW 12.8 11.5 - 15.5 %   Platelets 254 150 - 400 K/uL  Urinalysis, Routine w reflex microscopic (not at [facility])  Result Value Ref Range   Color, Urine YELLOW YELLOW   APPearance CLEAR CLEAR   Specific Gravity, Urine 1.010 1.005 - 1.030   pH 5.5 5.0 - 8.0   Glucose, UA NEGATIVE NEGATIVE mg/dL   Hgb urine dipstick NEGATIVE NEGATIVE   Bilirubin Urine NEGATIVE NEGATIVE   Ketones, ur NEGATIVE NEGATIVE mg/dL   Protein, ur NEGATIVE NEGATIVE mg/dL   Nitrite NEGATIVE NEGATIVE   Leukocytes, UA NEGATIVE NEGATIVE   Laboratory interpretation all normal except mild leukocytosis   Imaging Review No results found.    Ct Soft Tissue Neck W Contrast  12/20/2015  CLINICAL DATA:  Initial evaluation for 1 week history of sore throat.  IMPRESSION: Normal CT of the neck. No significant inflammatory changes or other abnormality identified. Electronically Signed   By: Jeannine Boga M.D.   On: 12/20/2015 02:56   US Abdomen Limited Ruq  01/02/2016  CLINICAL DATA:  Abdominal pain right upper quadrant . IMPRESSION: Negative Electronically Signed   By: Franchot Gallo M.D.   On: 01/02/2016 09:35    I have personally reviewed and evaluated these images and lab results as part of my medical decision-making.    MDM   Final diagnoses:  Right upper quadrant pain  Lipoma of abdominal wall   Plan discharge  Samuel Porter, MD, FACEP    I personally performed the services described in this documentation, which was  scribed in my presence. The recorded information has been reviewed and considered.  Samuel Porter, MD, Barbette Or, MD 01/09/16 [phone]

## 2016-01-08 NOTE — Telephone Encounter (Signed)
Pt had US done on 4/11 and was asking if his results were available. Please call [phone]

## 2016-01-08 NOTE — Telephone Encounter (Signed)
Routing to AS 

## 2016-01-08 NOTE — Telephone Encounter (Signed)
Please see result note 

## 2016-01-08 NOTE — ED Notes (Signed)
Patient complaining of right sided abdominal pain x 1 week. States he has had constipation with only 1 bowel movement in a week.

## 2016-01-09 ENCOUNTER — Emergency Department (HOSPITAL_COMMUNITY)
Admission: EM | Admit: 2016-01-09 | Discharge: 2016-01-09 | Disposition: A | Discharge status: Home / Self Care | Payer: BLUE CROSS/BLUE SHIELD | Attending: Emergency Medicine | Admitting: Emergency Medicine

## 2016-01-09 DIAGNOSIS — D171 Benign lipomatous neoplasm of skin and subcutaneous tissue of trunk: Secondary | ICD-10-CM

## 2016-01-09 DIAGNOSIS — R1011 Right upper quadrant pain: Secondary | ICD-10-CM

## 2016-01-09 NOTE — Discharge Instructions (Signed)
Follow up with your doctors as previously scheduled. Recheck if the swollen area gets painful, gets bigger, gets hard to touch with vomiting or fever.

## 2016-01-16 NOTE — Patient Instructions (Signed)
Samuel Blackwell  01/16/2016     @PREFPERIOPPHARMACY @   Your procedure is scheduled on  01/22/2016   Report to [facility] at  615  A.M.  Call this number if you have problems the morning of surgery:  [phone]   Remember:  Do not eat food or drink liquids after midnight.  Take these medicines the morning of surgery with A SIP OF WATER  Xanax, prilosec.   Do not wear jewelry, make-up or nail polish.  Do not wear lotions, powders, or perfumes.  You may wear deodorant.  Do not shave 48 hours prior to surgery.  Men may shave face and neck.  Do not bring valuables to the hospital.  [facility] is not responsible for any belongings or valuables.  Contacts, dentures or bridgework may not be worn into surgery.  Leave your suitcase in the car.  After surgery it may be brought to your room.  For patients admitted to the hospital, discharge time will be determined by your treatment team.  Patients discharged the day of surgery will not be allowed to drive home.   Name and phone number of your driver:   family Special instructions:  Follow the diet instructions given to you by Dr Roseanne Kaufman office.  Please read over the following fact sheets that you were given. Coughing and Deep Breathing, Surgical Site Infection Prevention, Anesthesia Post-op Instructions and Care and Recovery After Surgery      Esophageal Dilatation Esophageal dilatation is a procedure to open a blocked or narrowed part of the esophagus. The esophagus is the long tube in your throat that carries food and liquid from your mouth to your stomach. The procedure is also called esophageal dilation.  You may need this procedure if you have a buildup of scar tissue in your esophagus that makes it difficult, painful, or even impossible to swallow. This can be caused by gastroesophageal reflux disease (GERD). In rare cases, people need this procedure because they have cancer of the esophagus or a problem with the way  food moves through the esophagus. Sometimes you may need to have another dilatation to enlarge the opening of the esophagus gradually. LET [facility] CARE PROVIDER KNOW ABOUT:   Any allergies you have.  All medicines you are taking, including vitamins, herbs, eye drops, creams, and over-the-counter medicines.  Previous problems you or members of your family have had with the use of anesthetics.  Any blood disorders you have.  Previous surgeries you have had.  Medical conditions you have.  Any antibiotic medicines you are required to take before dental procedures. RISKS AND COMPLICATIONS Generally, this is a safe procedure. However, problems can occur and include:  Bleeding from a tear in the lining of the esophagus.  A hole (perforation) in the esophagus. BEFORE THE PROCEDURE  Do not eat or drink anything after midnight on the night before the procedure or as directed by your health care provider.  Ask your health care provider about changing or stopping your regular medicines. This is especially important if you are taking diabetes medicines or blood thinners.  Plan to have someone take you home after the procedure. PROCEDURE   You will be given a medicine that makes you relaxed and sleepy (sedative).  A medicine may be sprayed or gargled to numb the back of the throat.  Your health care provider can use various instruments to do an esophageal dilatation. During the procedure, the instrument  used will be placed in your mouth and passed down into your esophagus. Options include:  Simple dilators. This instrument is carefully placed in the esophagus to stretch it.  Guided wire bougies. In this method, a flexible tube (endoscope) is used to insert a wire into the esophagus. The dilator is passed over this wire to enlarge the esophagus. Then the wire is removed.  Balloon dilators. An endoscope with a small balloon at the end is passed down into the esophagus. Inflating the  balloon gently stretches the esophagus and opens it up. AFTER THE PROCEDURE  Your blood pressure, heart rate, breathing rate, and blood oxygen level will be monitored often until the medicines you were given have worn off.  Your throat may feel slightly sore and will probably still feel numb. This will improve slowly over time.  You will not be allowed to eat or drink until the throat numbness has resolved.  If this is a same-day procedure, you may be allowed to go home once you have been able to drink, urinate, and sit on the edge of the bed without nausea or dizziness.  If this is a same-day procedure, you should have a friend or family member with you for the next 24 hours after the procedure.   This information is not intended to replace advice given to you by your health care provider. Make sure you discuss any questions you have with your health care provider.   Document Released: 10/31/2005 Document Revised: 09/30/2014 Document Reviewed: 01/19/2014 Elsevier Interactive Patient Education 2016 [employer]. Esophagogastroduodenoscopy Esophagogastroduodenoscopy (EGD) is a procedure that is used to examine the lining of the esophagus, stomach, and first part of the small intestine (duodenum). A long, flexible, lighted tube with a camera attached (endoscope) is inserted down the throat to view these organs. This procedure is done to detect problems or abnormalities, such as inflammation, bleeding, ulcers, or growths, in order to treat them. The procedure lasts 5-20 minutes. It is usually an outpatient procedure, but it may need to be performed in a hospital in emergency cases. LET [facility] CARE PROVIDER KNOW ABOUT:  Any allergies you have.  All medicines you are taking, including vitamins, herbs, eye drops, creams, and over-the-counter medicines.  Previous problems you or members of your family have had with the use of anesthetics.  Any blood disorders you have.  Previous surgeries  you have had.  Medical conditions you have. RISKS AND COMPLICATIONS Generally, this is a safe procedure. However, problems can occur and include:  Infection.  Bleeding.  Tearing (perforation) of the esophagus, stomach, or duodenum.  Difficulty breathing or not being able to breathe.  Excessive sweating.  Spasms of the larynx.  Slowed heartbeat.  Low blood pressure. BEFORE THE PROCEDURE  Do not eat or drink anything after midnight on the night before the procedure or as directed by your health care provider.  Do not take your regular medicines before the procedure if your health care provider asks you not to. Ask your health care provider about changing or stopping those medicines.  If you wear dentures, be prepared to remove them before the procedure.  Arrange for someone to drive you home after the procedure. PROCEDURE  A numbing medicine (local anesthetic) may be sprayed in your throat for comfort and to stop you from gagging or coughing.  You will have an IV tube inserted in a vein in your hand or arm. You will receive medicines and fluids through this tube.  You will be  given a medicine to relax you (sedative).  A pain reliever will be given through the IV tube.  A mouth guard may be placed in your mouth to protect your teeth and to keep you from biting on the endoscope.  You will be asked to lie on your left side.  The endoscope will be inserted down your throat and into your esophagus, stomach, and duodenum.  Air will be put through the endoscope to allow your health care provider to clearly view the lining of your esophagus.  The lining of your esophagus, stomach, and duodenum will be examined. During the exam, your health care provider may:  Remove tissue to be examined under a microscope (biopsy) for inflammation, infection, or other medical problems.  Remove growths.  Remove objects (foreign bodies) that are stuck.  Treat any bleeding with medicines  or other devices that stop tissues from bleeding (hot cautery, clipping devices).  Widen (dilate) or stretch narrowed areas of your esophagus and stomach.  The endoscope will be withdrawn. AFTER THE PROCEDURE  You will be taken to a recovery area for observation. Your blood pressure, heart rate, breathing rate, and blood oxygen level will be monitored often until the medicines you were given have worn off.  Do not eat or drink anything until the numbing medicine has worn off and your gag reflex has returned. You may choke.  Your health care provider should be able to discuss his or her findings with you. It will take longer to discuss the test results if any biopsies were taken.   This information is not intended to replace advice given to you by your health care provider. Make sure you discuss any questions you have with your health care provider.   Document Released: 01/10/2005 Document Revised: 09/30/2014 Document Reviewed: 08/12/2012 Elsevier Interactive Patient Education 2016 [employer]. Esophagogastroduodenoscopy, Care After Refer to this sheet in the next few weeks. These instructions provide you with information about caring for yourself after your procedure. Your health care provider may also give you more specific instructions. Your treatment has been planned according to current medical practices, but problems sometimes occur. Call your health care provider if you have any problems or questions after your procedure. WHAT TO EXPECT AFTER THE PROCEDURE After your procedure, it is typical to feel:  Soreness in your throat.  Pain with swallowing.  Sick to your stomach (nauseous).  Bloated.  Dizzy.  Fatigued. HOME CARE INSTRUCTIONS  Do not eat or drink anything until the numbing medicine (local anesthetic) has worn off and your gag reflex has returned. You will know that the local anesthetic has worn off when you can swallow comfortably.  Do not drive or operate  machinery until directed by your health care provider.  Take medicines only as directed by your health care provider. SEEK MEDICAL CARE IF:   You cannot stop coughing.  You are not urinating at all or less than usual. SEEK IMMEDIATE MEDICAL CARE IF:  You have difficulty swallowing.  You cannot eat or drink.  You have worsening throat or chest pain.  You have dizziness or lightheadedness or you faint.  You have nausea or vomiting.  You have chills.  You have a fever.  You have severe abdominal pain.  You have black, tarry, or bloody stools.   This information is not intended to replace advice given to you by your health care provider. Make sure you discuss any questions you have with your health care provider.   Document Released: 08/26/2012  Document Revised: 09/30/2014 Document Reviewed: 08/26/2012 Elsevier Interactive Patient Education 2016 Else[employer] Inc. PATIENT INSTRUCTIONS POST-ANESTHESIA  IMMEDIATELY FOLLOWING SURGERY:  Do not drive or operate machinery for the first twenty four hours after surgery.  Do not make any important decisions for twenty four hours after surgery or while taking narcotic pain medications or sedatives.  If you develop intractable nausea and vomiting or a severe headache please notify your doctor immediately.  FOLLOW-UP:  Please make an appointment with your surgeon as instructed. You do not need to follow up with anesthesia unless specifically instructed to do so.  WOUND CARE INSTRUCTIONS (if applicable):  Keep a dry clean dressing on the anesthesia/puncture wound site if there is drainage.  Once the wound has quit draining you may leave it open to air.  Generally you should leave the bandage intact for twenty four hours unless there is drainage.  If the epidural site drains for more than 36-48 hours please call the anesthesia department.  QUESTIONS?:  Please feel free to call your physician or the hospital operator if you have any questions, and  they will be happy to assist you.

## 2016-01-17 ENCOUNTER — Encounter (HOSPITAL_COMMUNITY): Payer: Self-pay

## 2016-01-17 ENCOUNTER — Encounter (HOSPITAL_COMMUNITY)
Admission: RE | Admit: 2016-01-17 | Discharge: 2016-01-17 | Disposition: A | Discharge status: Home / Self Care | Payer: BLUE CROSS/BLUE SHIELD | Source: Ambulatory Visit | Attending: Internal Medicine | Admitting: Internal Medicine

## 2016-01-17 DIAGNOSIS — Z029 Encounter for administrative examinations, unspecified: Secondary | ICD-10-CM | POA: Insufficient documentation

## 2016-01-17 HISTORY — DX: Gastro-esophageal reflux disease without esophagitis: K21.9

## 2016-01-22 ENCOUNTER — Ambulatory Visit (HOSPITAL_COMMUNITY): Payer: BLUE CROSS/BLUE SHIELD | Admitting: Anesthesiology

## 2016-01-22 ENCOUNTER — Encounter (HOSPITAL_COMMUNITY): Payer: Self-pay | Admitting: *Deleted

## 2016-01-22 ENCOUNTER — Encounter (HOSPITAL_COMMUNITY)
Admission: RE | Disposition: A | Discharge status: Home / Self Care | Payer: Self-pay | Source: Ambulatory Visit | Attending: Internal Medicine

## 2016-01-22 ENCOUNTER — Ambulatory Visit (HOSPITAL_COMMUNITY)
Admission: RE | Admit: 2016-01-22 | Discharge: 2016-01-22 | Disposition: A | Discharge status: Home / Self Care | Payer: BLUE CROSS/BLUE SHIELD | Source: Ambulatory Visit | Attending: Internal Medicine | Admitting: Internal Medicine

## 2016-01-22 ENCOUNTER — Telehealth: Payer: Self-pay | Admitting: Internal Medicine

## 2016-01-22 DIAGNOSIS — K228 Other specified diseases of esophagus: Secondary | ICD-10-CM | POA: Diagnosis not present

## 2016-01-22 DIAGNOSIS — Z87891 Personal history of nicotine dependence: Secondary | ICD-10-CM | POA: Diagnosis not present

## 2016-01-22 DIAGNOSIS — R0789 Other chest pain: Secondary | ICD-10-CM | POA: Insufficient documentation

## 2016-01-22 DIAGNOSIS — R109 Unspecified abdominal pain: Secondary | ICD-10-CM | POA: Insufficient documentation

## 2016-01-22 DIAGNOSIS — F419 Anxiety disorder, unspecified: Secondary | ICD-10-CM | POA: Diagnosis not present

## 2016-01-22 DIAGNOSIS — R1310 Dysphagia, unspecified: Secondary | ICD-10-CM | POA: Diagnosis not present

## 2016-01-22 DIAGNOSIS — K229 Disease of esophagus, unspecified: Secondary | ICD-10-CM | POA: Insufficient documentation

## 2016-01-22 DIAGNOSIS — K449 Diaphragmatic hernia without obstruction or gangrene: Secondary | ICD-10-CM | POA: Insufficient documentation

## 2016-01-22 DIAGNOSIS — Z79899 Other long term (current) drug therapy: Secondary | ICD-10-CM | POA: Insufficient documentation

## 2016-01-22 DIAGNOSIS — R12 Heartburn: Secondary | ICD-10-CM | POA: Insufficient documentation

## 2016-01-22 DIAGNOSIS — K2289 Other specified disease of esophagus: Secondary | ICD-10-CM | POA: Insufficient documentation

## 2016-01-22 HISTORY — PX: MALONEY DILATION: SHX5535

## 2016-01-22 HISTORY — PX: ESOPHAGOGASTRODUODENOSCOPY (EGD) WITH PROPOFOL: SHX5813

## 2016-01-22 SURGERY — ESOPHAGOGASTRODUODENOSCOPY (EGD) WITH PROPOFOL
Anesthesia: Monitor Anesthesia Care

## 2016-01-22 MED ORDER — LIDOCAINE VISCOUS 2 % MT SOLN
5.0000 mL | Freq: Two times a day (BID) | OROMUCOSAL | Status: DC
  Administered 2016-01-22: 5 mL via OROMUCOSAL

## 2016-01-22 MED ORDER — LACTATED RINGERS IV SOLN: INTRAVENOUS | Status: DC

## 2016-01-22 MED ORDER — FENTANYL CITRATE (PF) 100 MCG/2ML IJ SOLN: 25.0000 ug | INTRAMUSCULAR | Status: DC | PRN

## 2016-01-22 MED ORDER — MIDAZOLAM HCL 5 MG/5ML IJ SOLN
INTRAMUSCULAR | Status: DC | PRN
  Administered 2016-01-22 (×2): 2 mg via INTRAVENOUS

## 2016-01-22 MED ORDER — MIDAZOLAM HCL 2 MG/2ML IJ SOLN
INTRAMUSCULAR | Status: AC
  Filled 2016-01-22: qty 2

## 2016-01-22 MED ORDER — PROPOFOL 10 MG/ML IV BOLUS
INTRAVENOUS | Status: AC
  Filled 2016-01-22: qty 40

## 2016-01-22 MED ORDER — LACTATED RINGERS IV SOLN
INTRAVENOUS | Status: DC
  Administered 2016-01-22: 1000 mL via INTRAVENOUS

## 2016-01-22 MED ORDER — ONDANSETRON HCL 4 MG/2ML IJ SOLN
INTRAMUSCULAR | Status: AC
  Filled 2016-01-22: qty 2

## 2016-01-22 MED ORDER — LIDOCAINE VISCOUS 2 % MT SOLN
OROMUCOSAL | Status: AC
  Filled 2016-01-22: qty 15

## 2016-01-22 MED ORDER — ONDANSETRON HCL 4 MG/2ML IJ SOLN
4.0000 mg | Freq: Once | INTRAMUSCULAR | Status: AC | PRN
  Administered 2016-01-22: 4 mg via INTRAVENOUS

## 2016-01-22 MED ORDER — PROPOFOL 500 MG/50ML IV EMUL
INTRAVENOUS | Status: DC | PRN
  Administered 2016-01-22: 08:00:00 via INTRAVENOUS
  Administered 2016-01-22: 150 ug/kg/min via INTRAVENOUS

## 2016-01-22 MED ORDER — MIDAZOLAM HCL 2 MG/2ML IJ SOLN: 1.0000 mg | INTRAMUSCULAR | Status: DC | PRN

## 2016-01-22 MED ORDER — MIDAZOLAM HCL 2 MG/2ML IJ SOLN
1.0000 mg | INTRAMUSCULAR | Status: DC | PRN
  Administered 2016-01-22 (×2): 2 mg via INTRAVENOUS
  Filled 2016-01-22 (×2): qty 2

## 2016-01-22 NOTE — Anesthesia Preprocedure Evaluation (Signed)
Anesthesia Evaluation  Patient identified by MRN, date of birth, ID band Patient awake    Reviewed: Allergy & Precautions, NPO status , Patient's Chart, lab work & pertinent test results, reviewed documented beta blocker date and time   Airway Mallampati: II  TM Distance: >3 FB Neck ROM: Full    Dental  (+) Teeth Intact, Missing,    Pulmonary former smoker,    Pulmonary exam normal        Cardiovascular Normal cardiovascular exam+ dysrhythmias Supra Ventricular Tachycardia      Neuro/Psych Anxiety    GI/Hepatic GERD  Medicated and Controlled,  Endo/Other  Morbid obesity  Renal/GU      Musculoskeletal   Abdominal Normal abdominal exam  (+)   Peds  Hematology   Anesthesia Other Findings   Reproductive/Obstetrics                             Anesthesia Physical Anesthesia Plan  ASA: III  Anesthesia Plan: MAC   Post-op Pain Management:    Induction: Intravenous  Airway Management Planned: Nasal Cannula  Additional Equipment:   Intra-op Plan:   Post-operative Plan:   Informed Consent: I have reviewed the patients History and Physical, chart, labs and discussed the procedure including the risks, benefits and alternatives for the proposed anesthesia with the patient or authorized representative who has indicated his/her understanding and acceptance.   Dental advisory given  Plan Discussed with: CRNA  Anesthesia Plan Comments:         Anesthesia Quick Evaluation

## 2016-01-22 NOTE — Transfer of Care (Signed)
Immediate Anesthesia Transfer of Care Note  Patient: Samuel Blackwell  Procedure(s) Performed: Procedure(s) with comments: ESOPHAGOGASTRODUODENOSCOPY (EGD) WITH PROPOFOL (N/A) - 7:30 [place] (N/A)  Patient Location: PACU  Anesthesia Type:MAC  Level of Consciousness: awake and patient cooperative  Airway & Oxygen Therapy: Patient Spontanous Breathing and Patient connected to face mask oxygen  Post-op Assessment: Report given to RN, Post -op Vital signs reviewed and stable and Patient moving all extremities  Post vital signs: Reviewed and stable  Last Vitals:  Filed Vitals:   01/22/16 0715 01/22/16 0730  BP: 140/88 148/94  Pulse:    Temp:    Resp: 13 35    Last Pain:  Filed Vitals:   01/22/16 0734  PainSc: 0-No pain      Patients Stated Pain Goal: 7 ([id number])  Complications: No apparent anesthesia complications

## 2016-01-22 NOTE — Op Note (Signed)
[facility] Patient Name: Samuel Blackwell Procedure Date: 01/22/2016 7:36 AM MRN: [id number] Date of Birth: 12-Nov-1990 Attending MD: Norvel Richards , MD CSN: [id number] Age: 25 Admit Type: Outpatient Procedure:                Upper GI endoscopy Indications:              Dysphagia, Heartburn Providers:                Norvel Richards, MD, Gwenlyn Fudge, RN, Isabella Stalling, Technician Referring MD:              Medicines:                Monitored Anesthesia Care Complications:            No immediate complications. Estimated Blood Loss:     Estimated blood loss: none. Procedure:                Pre-Anesthesia Assessment:                           - Prior to the procedure, a History and Physical                            was performed, and patient medications and                            allergies were reviewed. The patient's tolerance of                            previous anesthesia was also reviewed. The risks                            and benefits of the procedure and the sedation                            options and risks were discussed with the patient.                            All questions were answered, and informed consent                            was obtained. Prior Anticoagulants: The patient has                            taken no previous anticoagulant or antiplatelet                            agents. ASA Grade Assessment: II - A patient with                            mild systemic disease. After reviewing the risks  and benefits, the patient was deemed in                            satisfactory condition to undergo the procedure.                           After obtaining informed consent, the endoscope was                            passed under direct vision. Throughout the                            procedure, the patient's blood pressure, pulse, and                            oxygen saturations  were monitored continuously. The                            [id number]) scope was introduced through the                            mouth, and advanced to the second part of duodenum.                            The upper GI endoscopy was accomplished without                            difficulty. The patient tolerated the procedure                            well. Scope In: [id number] AM Scope Out: 7:57:51 AM Total Procedure Duration: 0 hours 10 minutes 54 seconds  Findings:      (1) 3 x 3 mm Island of salmon-colored mucosa were present at 37 cm. No       esophagitis. Tubular esophagus appeared patent throughout its course..       Estimated blood loss: none. This was biopsied with a cold forceps for       histology. Estimated blood loss was minimal.      A small hiatal hernia was present.      The exam was otherwise without abnormality.      The second portion of the duodenum was normal. The scope was withdrawn.       Dilation was performed with a Maloney dilator with no resistance at 56       Fr. The dilation site was examined following endoscope reinsertion and       showed no change. Finally, biopsies of the distal esophagus taken.       Biopsy of the "island" was taken separately. Impression:               - Salmon-colored mucosa. Dilated. Biopsied.                           - Small hiatal hernia.                           -  The examination was otherwise normal.                           - Normal second portion of the duodenum. Status                            post biopsy as described. Moderate Sedation:      Moderate (conscious) sedation was personally administered by an       anesthesia professional. The following parameters were monitored: oxygen       saturation, heart rate, blood pressure, respiratory rate, EKG, adequacy       of pulmonary ventilation, and response to care. Total physician       intraservice time was 17 minutes. Recommendation:           - Patient has a  contact number available for                            emergencies. The signs and symptoms of potential                            delayed complications were discussed with the                            patient. Return to normal activities tomorrow.                            Written discharge instructions were provided to the                            patient.                           - Resume previous diet.                           - stop omeprazole; begin Dexilant 60 mg daily.                           - Await pathology results. Procedure Code(s):        --- Professional ---                           [phone], Esophagogastroduodenoscopy, flexible,                            transoral; with biopsy, single or multiple                           43450, Dilation of esophagus, by unguided sound or                            bougie, single or multiple passes Diagnosis Code(s):        --- Professional ---                           K22.8, Other specified diseases of esophagus  K44.9, Diaphragmatic hernia without obstruction or                            gangrene                           R13.10, Dysphagia, unspecified                           R12, Heartburn CPT copyright 2016 American Medical Association. All rights reserved. The codes documented in this report are preliminary and upon coder review may  be revised to meet current compliance requirements. Cristopher Estimable. Shepherd Finnan, MD Norvel Richards, MD 01/22/2016 8:14:30 AM This report has been signed electronically. Number of Addenda: 0

## 2016-01-22 NOTE — Anesthesia Postprocedure Evaluation (Signed)
Anesthesia Post Note  Patient: ARTHAR SICHER  Procedure(s) Performed: Procedure(s) (LRB): ESOPHAGOGASTRODUODENOSCOPY (EGD) WITH PROPOFOL (N/A) MALONEY DILATION (N/A)  Patient location during evaluation: PACU Anesthesia Type: MAC Level of consciousness: awake, oriented and patient cooperative Pain management: pain level controlled Vital Signs Assessment: post-procedure vital signs reviewed and stable Respiratory status: spontaneous breathing, nonlabored ventilation and respiratory function stable Cardiovascular status: blood pressure returned to baseline and stable Postop Assessment: no signs of nausea or vomiting Anesthetic complications: no    Last Vitals:  Filed Vitals:   01/22/16 0715 01/22/16 0730  BP: 140/88 148/94  Pulse:    Temp:    Resp: 13 35    Last Pain:  Filed Vitals:   01/22/16 0734  PainSc: 0-No pain                 Archita Lomeli J

## 2016-01-22 NOTE — Telephone Encounter (Signed)
Tommi from Short Stay called to make PP FU in 3 months for patient and said the patient would stop by the office for either samples of Dexilant or a discount card.

## 2016-01-22 NOTE — Discharge Instructions (Signed)
EGD Discharge instructions Please read the instructions outlined below and refer to this sheet in the next few weeks. These discharge instructions provide you with general information on caring for yourself after you leave the hospital. Your doctor may also give you specific instructions. While your treatment has been planned according to the most current medical practices available, unavoidable complications occasionally occur. If you have any problems or questions after discharge, please call your doctor. ACTIVITY  You may resume your regular activity but move at a slower pace for the next 24 hours.   Take frequent rest periods for the next 24 hours.   Walking will help expel (get rid of) the air and reduce the bloated feeling in your abdomen.   No driving for 24 hours (because of the anesthesia (medicine) used during the test).   You may shower.   Do not sign any important legal documents or operate any machinery for 24 hours (because of the anesthesia used during the test).  NUTRITION  Drink plenty of fluids.   You may resume your normal diet.   Begin with a light meal and progress to your normal diet.   Avoid alcoholic beverages for 24 hours or as instructed by your caregiver.  MEDICATIONS  You may resume your normal medications unless your caregiver tells you otherwise.  WHAT YOU CAN EXPECT TODAY  You may experience abdominal discomfort such as a feeling of fullness or gas pains.  FOLLOW-UP  Your doctor will discuss the results of your test with you.  SEEK IMMEDIATE MEDICAL ATTENTION IF ANY OF THE FOLLOWING OCCUR:  Excessive nausea (feeling sick to your stomach) and/or vomiting.   Severe abdominal pain and distention (swelling).   Trouble swallowing.   Temperature over 101 F (37.8 C).   Rectal bleeding or vomiting of blood.     GERD information provided  Stop omeprazole; begin Dexilant 60 mg daily  Office visit with [place] in 3 months  Further  recommendations to follow pending review of pathology report

## 2016-01-22 NOTE — Telephone Encounter (Signed)
1 box of dexilant and discount card are at the front desk.

## 2016-01-22 NOTE — H&P (View-Only) (Signed)
Primary Care Physician:  Tonye Becket Primary Gastroenterologist:  Dr. Gala Romney   Chief Complaint  Patient presents with  . Abdominal Pain    HPI:   Samuel Blackwell is a 25 y.o. male presenting today at the request of the ED secondary to abdominal pain, chest discomfort. Has a pain in mid chest, lasts for about 2 days, intermittent every few hours. Has been going on since September 2016.  Last week or 2 has noted a globus sensation. Last 2 days much improved. Takes Omeprazole daily for at least a year. Has history of chronic severe reflux as a teenager, taking Tums historically. In last week or so, feels like right lower quadrant is more swollen than the rest of his abdomen. Not painful but "aches". Unrelated to eating. No N/V. No dysphagia. States his whole family has had their gallbladder out.   Due to globus sensation, CT neck performed in ED and was normal.   Past Medical History  Diagnosis Date  . Migraines   . SVT (supraventricular tachycardia) ([place])   . Anxiety   . Sinus tachycardia ([place])     r/t anxiety    Past Surgical History  Procedure Laterality Date  . Electrophysiologic study N/A 06/28/2015    Procedure: SVT Ablation;  Surgeon: Evans Lance, MD;  Location: [facility];  Service: Cardiovascular;  Laterality: N/A;  . Ablation of dysrhythmic focus  06/28/2015    SVT    Current Outpatient Prescriptions  Medication Sig Dispense Refill  . ALPRAZolam (XANAX) 1 MG tablet Take 1 mg by mouth 2 (two) times daily as needed for anxiety.    Marland Kitchen ibuprofen (ADVIL,MOTRIN) 800 MG tablet Take 800 mg by mouth every 8 (eight) hours as needed for mild pain or moderate pain.     Marland Kitchen omeprazole (PRILOSEC) 40 MG capsule Take 40 mg by mouth daily.    . Potassium 99 MG TABS Take 99 mg by mouth daily.      No current facility-administered medications for this visit.    Allergies as of 12/27/2015  . (No Known Allergies)    Family History  Problem Relation Age of Onset  .  Diabetes Other   . Cancer Other   . Heart failure Other   . COPD Mother   . Hypertension Father   . Lung cancer Maternal Grandfather   . Heart attack Paternal Grandmother   . Heart attack Paternal Grandfather   . Autism Brother   . Autism Brother   . Colon cancer Neg Hx   . Gallbladder disease      multiple family members    Social History   Social History  . Marital Status: Single    Spouse Name: N/A  . Number of Children: N/A  . Years of Education: N/A   Occupational History  . [employer]    Social History Main Topics  . Smoking status: Former Smoker -- 0.25 packs/day for 6 years    Types: Cigarettes    Start date: 12/01/2007    Quit date: 03/23/2014  . Smokeless tobacco: Former Systems developer    Types: [place] date: 09/19/2014  . Alcohol Use: No  . Drug Use: No  . Sexual Activity:    Partners: Female   Other Topics Concern  . Not on file   Social History Narrative    Review of Systems: Negative unless mentioned in HPI.   Physical Exam: BP 124/75 mmHg  Pulse 82  Temp(Src)  97.6 F (36.4 C) (Oral)  Ht 5\' 8"  (1.727 m)  Wt 237 lb 12.8 oz (107.865 kg)  BMI 36.17 kg/m2 General:   Alert and oriented. Pleasant and cooperative. Well-nourished and well-developed.  Head:  Normocephalic and atraumatic. Eyes:  Without icterus, sclera clear and conjunctiva pink.  Ears:  Normal auditory acuity. Nose:  No deformity, discharge,  or lesions. Mouth:  No deformity or lesions, oral mucosa pink.  Lungs:  Clear to auscultation bilaterally. No wheezes, rales, or rhonchi. No distress.  Heart:  S1, S2 present without murmurs appreciated.  Abdomen:  +BS, soft, mild TTP RUQ with inspiration and non-distended. No HSM noted. No guarding or rebound. No masses appreciated.  Rectal:  Deferred  Msk:  Symmetrical without gross deformities. Normal posture. Extremities:  Without edema. Neurologic:  Alert and  oriented x4;  grossly normal neurologically. Psych:  Alert and cooperative.  Normal mood and affect.

## 2016-01-22 NOTE — Interval H&P Note (Signed)
History and Physical Interval Note:  01/22/2016 7:29 AM  Samuel Blackwell  has presented today for surgery, with the diagnosis of GERD, globus sensation  The various methods of treatment have been discussed with the patient and family. After consideration of risks, benefits and other options for treatment, the patient has consented to  Procedure(s) with comments: ESOPHAGOGASTRODUODENOSCOPY (EGD) WITH PROPOFOL (N/A) - 7:30 MALONEY DILATION (N/A) as a surgical intervention .  The patient's history has been reviewed, patient examined, no change in status, stable for surgery.  I have reviewed the patient's chart and labs.  Questions were answered to the patient's satisfaction.     Samuel Blackwell  No change; EGD/ed per plan. The risks, benefits, limitations, alternatives and imponderables have been reviewed with the patient. Potential for esophageal dilation, biopsy, etc. have also been reviewed.  Questions have been answered. All parties agreeable.

## 2016-01-24 ENCOUNTER — Encounter (HOSPITAL_COMMUNITY): Payer: Self-pay | Admitting: Internal Medicine

## 2016-01-24 ENCOUNTER — Encounter: Payer: Self-pay | Admitting: Internal Medicine

## 2016-02-03 ENCOUNTER — Emergency Department (HOSPITAL_COMMUNITY): Payer: BLUE CROSS/BLUE SHIELD

## 2016-02-03 ENCOUNTER — Emergency Department (HOSPITAL_COMMUNITY)
Admission: EM | Admit: 2016-02-03 | Discharge: 2016-02-04 | Disposition: A | Discharge status: Home / Self Care | Payer: BLUE CROSS/BLUE SHIELD | Attending: Emergency Medicine | Admitting: Emergency Medicine

## 2016-02-03 ENCOUNTER — Encounter (HOSPITAL_COMMUNITY): Payer: Self-pay | Admitting: Emergency Medicine

## 2016-02-03 DIAGNOSIS — Z8679 Personal history of other diseases of the circulatory system: Secondary | ICD-10-CM | POA: Diagnosis not present

## 2016-02-03 DIAGNOSIS — R002 Palpitations: Secondary | ICD-10-CM | POA: Diagnosis not present

## 2016-02-03 DIAGNOSIS — Z87891 Personal history of nicotine dependence: Secondary | ICD-10-CM | POA: Insufficient documentation

## 2016-02-03 DIAGNOSIS — K219 Gastro-esophageal reflux disease without esophagitis: Secondary | ICD-10-CM | POA: Diagnosis not present

## 2016-02-03 DIAGNOSIS — Z79899 Other long term (current) drug therapy: Secondary | ICD-10-CM | POA: Diagnosis not present

## 2016-02-03 DIAGNOSIS — F419 Anxiety disorder, unspecified: Secondary | ICD-10-CM | POA: Insufficient documentation

## 2016-02-03 HISTORY — DX: Diaphragmatic hernia without obstruction or gangrene: K44.9

## 2016-02-03 LAB — CBC
HEMATOCRIT: 45.9 % (ref 39.0–52.0)
Hemoglobin: 16 g/dL (ref 13.0–17.0)
MCH: 28.4 pg (ref 26.0–34.0)
MCHC: 34.9 g/dL (ref 30.0–36.0)
MCV: 81.4 fL (ref 78.0–100.0)
PLATELETS: 244 10*3/uL (ref 150–400)
RBC: 5.64 MIL/uL (ref 4.22–5.81)
RDW: 12.7 % (ref 11.5–15.5)
WBC: 10.3 10*3/uL (ref 4.0–10.5)

## 2016-02-03 LAB — BASIC METABOLIC PANEL
ANION GAP: 9 (ref 5–15)
BUN: 11 mg/dL (ref 6–20)
CHLORIDE: 101 mmol/L (ref 101–111)
CO2: 22 mmol/L (ref 22–32)
Calcium: 8.8 mg/dL — ABNORMAL LOW (ref 8.9–10.3)
Creatinine, Ser: 0.86 mg/dL (ref 0.61–1.24)
GFR calc non Af Amer: >60 mL/min (ref >60)
Glucose, Bld: 103 mg/dL — ABNORMAL HIGH (ref 65–99)
Potassium: 5.6 mmol/L — ABNORMAL HIGH (ref 3.5–5.1)
Sodium: 132 mmol/L — ABNORMAL LOW (ref 135–145)

## 2016-02-03 LAB — I-STAT TROPONIN, ED: Troponin i, poc: 0 ng/mL (ref 0.00–0.08)

## 2016-02-03 LAB — POTASSIUM: Potassium: 4 mmol/L (ref 3.5–5.1)

## 2016-02-03 MED ORDER — ACETAMINOPHEN 325 MG PO TABS
650.0000 mg | ORAL_TABLET | Freq: Once | ORAL | Status: AC
  Administered 2016-02-03: 650 mg via ORAL
  Filled 2016-02-03: qty 2

## 2016-02-03 NOTE — Discharge Instructions (Signed)
Follow-up with your primary care doctor. If you have recurrent chest pain, shortness of breath, or palpitations please follow-up with Dr. Lovena Le. Return here for new concerns.

## 2016-02-03 NOTE — ED Notes (Signed)
Brought by EMS from work.  Noticed heart rate was elevated with aching pain on left chest that comes and goes.  Reports aching pain in left arm yesterday.  History of ablation at 25 years old for SVT.  States I think I was having a panic attack before noticing the elevated heart rate.  CBG-107.  Received 366ml's NS via EMS.

## 2016-02-03 NOTE — ED Provider Notes (Signed)
CSN: [id number]     Arrival date & time 02/03/16  1934 History   First MD Initiated Contact with Patient 02/03/16 1937     Chief Complaint  Patient presents with  . Tachycardia     (Consider location/radiation/quality/duration/timing/severity/associated sxs/prior Treatment) The history is provided by the patient and medical records.    25 year old male with history of migraine headaches, SVT status post ablation, anxiety, GERD, presenting to the ED for palpitations. Patient was at work earlier today at [employer] when he began to feel some palpitations which then caused him to have a "panic attack". Patient states when this happened he began hyperventilating and developed some left arm paresthesias. He states at the time it felt as though "left arm was locked up".  He states all his symptoms resolved when he was with EMS. He states he did have some aching pain in his arm and left shoulder yesterday. He states he recently has started working out again at the gym and is unsure if this could be contributing to his symptoms. He denies any current chest pain, shortness of breath, dizziness, diaphoresis, nausea, or vomiting. He has no cardiac history aside from the SVT. He does have strong family history of cardiac disease on his father's side. He is a former smoker.  No illicit drug use.  Patient does admit he has had worsening anxiety over the past few weeks.  He states he is concerned that his SVT will recur.  His ablation was performed by Dr. Lovena Le here in [place], [place].  He states his follow-up appts have all gone well.  He does take xanax daily for his anxiety.  Past Medical History  Diagnosis Date  . Migraines   . SVT (supraventricular tachycardia) ([place])   . Anxiety   . Sinus tachycardia ([place])     r/t anxiety  . GERD (gastroesophageal reflux disease)   . Hiatal hernia    Past Surgical History  Procedure Laterality Date  . Electrophysiologic study N/A 06/28/2015    Procedure: SVT Ablation;   Surgeon: Evans Lance, MD;  Location: [facility];  Service: Cardiovascular;  Laterality: N/A;  . Ablation of dysrhythmic focus  06/28/2015    SVT  . Esophagogastroduodenoscopy (egd) with propofol N/A 01/22/2016    Procedure: ESOPHAGOGASTRODUODENOSCOPY (EGD) WITH PROPOFOL;  Surgeon: Daneil Dolin, MD;  [facility];  Service: Endoscopy;  Laterality: N/A;  7:30  . Maloney dilation N/A 01/22/2016    Procedure: Venia Minks DILATION;  Surgeon: Daneil Dolin, MD;  [facility]: [facility];  Service: Endoscopy;  Laterality: N/A;   Family History  Problem Relation Age of Onset  . Diabetes Other   . Cancer Other   . Heart failure Other   . COPD Mother   . Hypertension Father   . Lung cancer Maternal Grandfather   . Heart attack Paternal Grandmother   . Heart attack Paternal Grandfather   . Autism Brother   . Autism Brother   . Colon cancer Neg Hx   . Gallbladder disease      multiple family members   Social History  Substance Use Topics  . Smoking status: Former Smoker -- 0.25 packs/day for 6 years    Types: Cigarettes    Start date: 12/01/2007    Quit date: 03/23/2014  . Smokeless tobacco: Former Systems developer    Types: [place] date: 09/19/2014  . Alcohol Use: No    Review of Systems  Cardiovascular: Positive for palpitations.  Psychiatric/Behavioral: The patient is nervous/anxious.   All other systems reviewed and are negative.     Allergies  Review of patient's allergies indicates no known allergies.  Home Medications   Prior to Admission medications   Medication Sig Start Date End Date Taking? Authorizing Provider  ALPRAZolam [name]) 1 MG tablet Take 1 mg by mouth 2 (two) times daily as needed for anxiety.   Yes Historical Provider, MD  DEXILANT 60 MG capsule Take 60 mg by mouth daily. 01/22/16  Yes Historical Provider, MD  ibuprofen (ADVIL,MOTRIN) 800 MG tablet Take 800 mg by mouth every 8 (eight) hours as needed for mild pain or moderate pain.    Yes  Historical Provider, MD  Potassium 99 MG TABS Take 99 mg by mouth daily.    Yes Historical Provider, MD   BP 149/75 mmHg  Pulse 96  Temp(Src) 97.9 F (36.6 C) (Oral)  Resp 23  Ht 5\' 8"  (1.727 m)  Wt 110.678 kg  BMI 37.11 kg/m2  SpO2 98%   Physical Exam  Constitutional: He is oriented to person, place, and time. He appears well-developed and well-nourished. No distress.  HENT:  Head: Normocephalic and atraumatic.  Mouth/Throat: Oropharynx is clear and moist.  Eyes: Conjunctivae and EOM are normal. Pupils are equal, round, and reactive to light.  Neck: Normal range of motion. Neck supple.  Cardiovascular: Normal rate, regular rhythm and normal heart sounds.   Pulmonary/Chest: Effort normal and breath sounds normal. No respiratory distress. He has no wheezes. He has no rhonchi.  Abdominal: Soft. Bowel sounds are normal. There is no tenderness. There is no guarding.  Musculoskeletal: Normal range of motion. He exhibits no edema.  Neurological: He is alert and oriented to person, place, and time.  Skin: Skin is warm and dry. He is not diaphoretic.  Psychiatric: His mood appears anxious.  Appears anxious, asking repetitively if he is "ok"  Nursing note and vitals reviewed.   ED Course  Procedures (including critical care time) Labs Review Labs Reviewed  BASIC METABOLIC PANEL - Abnormal; Notable for the following:    Sodium 132 (*)    Potassium 5.6 (*)    Glucose, Bld 103 (*)    Calcium 8.8 (*)    All other components within normal limits  CBC  POTASSIUM  I-STAT TROPOININ, ED    Imaging Review Dg Chest 2 View  02/03/2016  CLINICAL DATA:  Chest pain and shortness of Breath EXAM: CHEST  2 VIEW COMPARISON:  06/10/2015 FINDINGS: The heart size and mediastinal contours are within normal limits. Both lungs are clear. The visualized skeletal structures are unremarkable. IMPRESSION: No active cardiopulmonary disease. Electronically Signed   By: Inez Catalina M.D.   On: 02/03/2016  20:13   I have personally reviewed and evaluated these images and lab results as part of my medical decision-making.   EKG Interpretation   Date/Time:  Saturday Feb 03 2016 19:35:20 EDT Ventricular Rate:  102 PR Interval:  158 QRS Duration: 101 QT Interval:  329 QTC Calculation: 428 R Axis:   -4 Text Interpretation:  Sinus tachycardia Since last tracing rate faster  Otherwise no significant change Confirmed by FLOYD MD, Quillian Quince [id number]) on  02/03/2016 7:41:46 PM      MDM   Final diagnoses:  Palpitations   25 y.o. M here with palpitations at work today.  Hx of SVT with ablation and no recurrences since.  Reports he did have a panic attack after palpitations began.  Symptoms resolved while in ambulance with  EMS.  VSS on arrival.  Patient does appear somewhat anxious on my exam.  His EKG is overall reassuring.  CXR is clear.  Initial labs with elevated potassium, repeat is WNL.  Suspect hemolysis on initial sample.  Patient has remained asymptomatic here in ED.  Suspect his symptoms today at least in part due to his anxiety.  He does express that he is very anxious about recurrences of his SVT.  No traces of this on 12 lead with EMS or on EKG's here today.  Patient with low risk heart score of 1.  Doubt ACS, PE, dissection.  Patient appears stable for discharge.  Encouraged to follow-up with his cardiologist if symptoms recur.  Discussed plan with patient, he/she acknowledged understanding and agreed with plan of care.  Return precautions given for new or worsening symptoms.  Larene Pickett, PA-C 02/04/16 [place], DO 02/04/16 [phone]

## 2016-02-20 ENCOUNTER — Encounter: Payer: Self-pay | Admitting: Adult Health

## 2016-02-20 ENCOUNTER — Ambulatory Visit (INDEPENDENT_AMBULATORY_CARE_PROVIDER_SITE_OTHER): Payer: BLUE CROSS/BLUE SHIELD | Admitting: Adult Health

## 2016-02-20 VITALS — BP 144/96 | HR 96 | Ht 68.0 in | Wt 224.0 lb

## 2016-02-20 DIAGNOSIS — F419 Anxiety disorder, unspecified: Secondary | ICD-10-CM

## 2016-02-20 DIAGNOSIS — I471 Supraventricular tachycardia: Secondary | ICD-10-CM | POA: Diagnosis not present

## 2016-02-20 DIAGNOSIS — R079 Chest pain, unspecified: Secondary | ICD-10-CM | POA: Diagnosis not present

## 2016-02-20 NOTE — Progress Notes (Deleted)
Name: Samuel Blackwell    DOB: 04/13/1991  Age: 25 y.o.  MR#: [id number]       PCP:  Tonye Becket      Insurance: Payor: BLUE CROSS BLUE SHIELD / Plan: BCBS OTHER / Product Type: *No Product type* /   CC:    Chief Complaint  Patient presents with  . Tachycardia    VS Filed Vitals:   02/20/16 1322  BP: 144/96  Pulse: 96  Height: 5\' 8"  (1.727 m)  Weight: 224 lb (101.606 kg)  SpO2: 96%    Weights Current Weight  02/20/16 224 lb (101.606 kg)  02/03/16 244 lb (110.678 kg)  01/17/16 243 lb 9.6 oz (110.496 kg)    Blood Pressure  BP Readings from Last 3 Encounters:  02/20/16 144/96  02/03/16 134/78  01/22/16 122/78     Admit date:  (Not on file) Last encounter with RMR:  12/29/2015   Allergy Review of patient's allergies indicates no known allergies.  Current Outpatient Prescriptions  Medication Sig Dispense Refill  . ALPRAZolam (XANAX) 1 MG tablet Take 1 mg by mouth 2 (two) times daily as needed for anxiety.    . DEXILANT 60 MG capsule Take 60 mg by mouth daily.    Marland Kitchen FLUoxetine (PROZAC) 20 MG capsule Take 20 mg by mouth daily.    Marland Kitchen ibuprofen (ADVIL,MOTRIN) 800 MG tablet Take 800 mg by mouth every 8 (eight) hours as needed for mild pain or moderate pain.     Marland Kitchen Potassium 99 MG TABS Take 99 mg by mouth daily.      No current facility-administered medications for this visit.    Discontinued Meds:   There are no discontinued medications.  Patient Active Problem List   Diagnosis Date Noted  . Dysphagia   . Mucosal abnormality of esophagus   . Hiatal hernia   . Abdominal pain 12/27/2015  . Globus sensation 12/27/2015  . Chest pain 06/14/2015  . GERD (gastroesophageal reflux disease) 06/14/2015  . Anxiety 05/24/2015  . SVT (supraventricular tachycardia) (HCC) 09/29/2014    LABS    Component Value Date/Time   NA 132* 02/03/2016 1958   NA 137 01/08/2016 2132   NA 138 06/21/2015 0956   K 4.0 02/03/2016 2240   K 5.6* 02/03/2016 1958   K 3.7 01/08/2016 2132   CL  101 02/03/2016 1958   CL 104 01/08/2016 2132   CL 103 06/21/2015 0956   CO2 22 02/03/2016 1958   CO2 24 01/08/2016 2132   CO2 31 06/21/2015 0956   GLUCOSE 103* 02/03/2016 1958   GLUCOSE 118* 01/08/2016 2132   GLUCOSE 95 06/21/2015 0956   BUN 11 02/03/2016 1958   BUN 14 01/08/2016 2132   BUN 9 06/21/2015 0956   CREATININE 0.86 02/03/2016 1958   CREATININE 0.92 01/08/2016 2132   CREATININE 0.84 06/21/2015 0956   CALCIUM 8.8* 02/03/2016 1958   CALCIUM 9.1 01/08/2016 2132   CALCIUM 9.8 06/21/2015 0956   GFRNONAA >60 02/03/2016 1958   GFRNONAA >60 01/08/2016 2132   GFRNONAA >60 04/30/2015 1934   GFRAA >60 02/03/2016 1958   GFRAA >60 01/08/2016 2132   GFRAA >60 04/30/2015 1934   CMP     Component Value Date/Time   NA 132* 02/03/2016 1958   K 4.0 02/03/2016 2240   CL 101 02/03/2016 1958   CO2 22 02/03/2016 1958   GLUCOSE 103* 02/03/2016 1958   BUN 11 02/03/2016 1958   CREATININE 0.86 02/03/2016 1958   CALCIUM 8.8* 02/03/2016 1958  PROT 7.2 01/08/2016 2132   ALBUMIN 4.4 01/08/2016 2132   AST 23 01/08/2016 2132   ALT 50 01/08/2016 2132   ALKPHOS 96 01/08/2016 2132   BILITOT 0.8 01/08/2016 2132   GFRNONAA >60 02/03/2016 1958   GFRAA >60 02/03/2016 1958       Component Value Date/Time   WBC 10.3 02/03/2016 1958   WBC 10.6* 01/08/2016 2132   WBC 8.4 06/21/2015 0956   HGB 16.0 02/03/2016 1958   HGB 16.0 01/08/2016 2132   HGB 16.9 06/21/2015 0956   HCT 45.9 02/03/2016 1958   HCT 46.7 01/08/2016 2132   HCT 49.6 06/21/2015 0956   MCV 81.4 02/03/2016 1958   MCV 82.7 01/08/2016 2132   MCV 84.7 06/21/2015 0956    Lipid Panel  No results found for: CHOL, TRIG, HDL, CHOLHDL, VLDL, LDLCALC, LDLDIRECT  ABG    Component Value Date/Time   TCO2 25 06/10/2015 1817     Lab Results  Component Value Date   TSH 2.556 09/19/2014   BNP (last 3 results) No results for input(s): BNP in the last 8760 hours.  ProBNP (last 3 results) No results for input(s): PROBNP in the  last 8760 hours.  Cardiac Panel (last 3 results) No results for input(s): CKTOTAL, CKMB, TROPONINI, RELINDX in the last 72 hours.  Iron/TIBC/Ferritin/ %Sat No results found for: IRON, TIBC, FERRITIN, IRONPCTSAT   EKG Orders placed or performed during the hospital encounter of 02/03/16  . EKG 12-Lead  . EKG 12-Lead  . EKG 12-Lead  . EKG 12-Lead  . ED EKG within 10 minutes  . ED EKG within 10 minutes  . EKG     Prior Assessment and Plan Problem List as of 02/20/2016      Cardiovascular and Mediastinum   SVT (supraventricular tachycardia) [facility])   Last Assessment & Plan 08/08/2015 Office Visit Written 08/08/2015  6:08 PM by Evans Lance, MD    He has had no recurrent symptomatic SVT. He will undergo watchful waiting.        Respiratory   Hiatal hernia     Digestive   GERD (gastroesophageal reflux disease)   Last Assessment & Plan 12/27/2015 Office Visit Written 12/27/2015 11:49 AM by Orvil Feil, NP    25 year old male with history of chronic GERD, now with intermittent chest discomfort that is likely reflux exacerbation. Has been evaluated by cardiology in the past. Chronically on omeprazole, which for the most part works well for him. He declines changing PPIs currently. May have some element of esophageal spasms a well. With new onset globus sensation as well, will proceed with upper endoscopy.   Proceed with upper endoscopy +/- dilation in the near future with Dr. Gala Romney. The risks, benefits, and alternatives have been discussed in detail with patient. They have stated understanding and desire to proceed.  PROPOFOL due to chronic Xanax use Continue Prilosec for now. May need to switch to a different PPI in the future      Dysphagia   Mucosal abnormality of esophagus     Other   Anxiety   Last Assessment & Plan 08/08/2015 Office Visit Written 08/08/2015  6:08 PM by Evans Lance, MD    His anxiety has improved. He is encouraged to wean himself off of his benzodiazepines,  and consider additional medical therapy as needed.      Chest pain   Last Assessment & Plan 06/14/2015 Office Visit Written 06/14/2015  2:04 PM by Imogene Burn, PA-C    Patient  was in the emergency room with sharp shooting chest pain that was fleeting. Cardiac enzymes were negative and EKG was unchanged. Symptoms were relieved with a GI cocktail. He has had no further chest pain. He is scheduled for ablation on 06/28/15 with Dr. Crissie Sickles. Proceed with this appointment.      Abdominal pain   Last Assessment & Plan 12/27/2015 Office Visit Written 12/27/2015 11:50 AM by Orvil Feil, NP    Vague right-sided abdominal discomfort. Patient is concerned about his gallbladder, stating multiple family members have had issues. Proceed with routine US abdomen. Does not appear to be acutely ill or have any concerning signs/symptoms. Doubt biliary etiology at this time.       Globus sensation   Last Assessment & Plan 12/27/2015 Office Visit Written 12/27/2015 11:50 AM by Orvil Feil, NP    In setting of chronic GERD. EGD planned.           Imaging: Dg Chest 2 View  02/03/2016  CLINICAL DATA:  Chest pain and shortness of Breath EXAM: CHEST  2 VIEW COMPARISON:  06/10/2015 FINDINGS: The heart size and mediastinal contours are within normal limits. Both lungs are clear. The visualized skeletal structures are unremarkable. IMPRESSION: No active cardiopulmonary disease. Electronically Signed   By: Inez Catalina M.D.   On: 02/03/2016 20:13

## 2016-02-20 NOTE — Progress Notes (Signed)
Cardiology Office Note   Date:  02/20/2016   ID:  [name], DOB 05-30-1991, MRN [id number]  PCP:  Tonye Becket  Cardiologist:  Woodroe Chen, NP   Chief Complaint  Patient presents with  . Tachycardia      History of Present Illness Samuel Blackwell is a 25 y.o. male who presents for ongoing assessment and management of SVT, status post ablation in 2016. The patient other history includes migraines and anxiety.he was last seen in the office in April of 2017 after having rapid heart rate in the setting of wisdom teeth extraction. EKG revealed significant artifact and not SVT. He was given reassurance and told that he could proceed with wisdom teeth extraction.  Unfortunately, the patient was seen in the emergency room on 02/04/2016 with complaints of rapid heart rhythm and what he describes as a panic attack and palpitations. EMS was called and his symptoms had resolved by the time EMS had arrived. In ER he remaine to followup with ad in normal sinus rhythm with unremarkable labs. It was felt that the patient was having anxiety causing symptoms. He is advised to followup with cardiology if symptoms recurred.  He returns today with continued complaints of anxiety and rapid heart rhythm. He would like to have another EKG to confirm that his heart rhythm is normal.He has recently been placed on Prozac, but states that it makes him yawn a lot and has not helped him feel better. He also states that he has an appointment with a psychiatrist tomorrow.  Past Medical History  Diagnosis Date  . Migraines   . SVT (supraventricular tachycardia) ([place])   . Anxiety   . Sinus tachycardia ([place])     r/t anxiety  . GERD (gastroesophageal reflux disease)   . Hiatal hernia     Past Surgical History  Procedure Laterality Date  . Electrophysiologic study N/A 06/28/2015    Procedure: SVT Ablation;  Surgeon: Evans Lance, MD;  Location: [facility];  Service: Cardiovascular;   Laterality: N/A;  . Ablation of dysrhythmic focus  06/28/2015    SVT  . Esophagogastroduodenoscopy (egd) with propofol N/A 01/22/2016    Procedure: ESOPHAGOGASTRODUODENOSCOPY (EGD) WITH PROPOFOL;  Surgeon: Daneil Dolin, MD;  [facility];  Service: Endoscopy;  Laterality: N/A;  7:30  . Maloney dilation N/A 01/22/2016    Procedure: Venia Minks DILATION;  Surgeon: Daneil Dolin, MD;  [facility];  Service: Endoscopy;  Laterality: N/A;     Current Outpatient Prescriptions  Medication Sig Dispense Refill  . ALPRAZolam (XANAX) 1 MG tablet Take 1 mg by mouth 2 (two) times daily as needed for anxiety.    . DEXILANT 60 MG capsule Take 60 mg by mouth daily.    Marland Kitchen FLUoxetine (PROZAC) 20 MG capsule Take 20 mg by mouth daily.    Marland Kitchen ibuprofen (ADVIL,MOTRIN) 800 MG tablet Take 800 mg by mouth every 8 (eight) hours as needed for mild pain or moderate pain.     Marland Kitchen Potassium 99 MG TABS Take 99 mg by mouth daily.      No current facility-administered medications for this visit.    Allergies:   Review of patient's allergies indicates no known allergies.    Social History:  The patient  reports that he quit smoking about 22 months ago. His smoking use included Cigarettes. He started smoking about 8 years ago. He has a 1.5 pack-year smoking history. He quit smokeless tobacco use about 17 months ago.  His smokeless tobacco use included Chew. He reports that he does not drink alcohol or use illicit drugs.   Family History:  The patient's family history includes Autism in his brother and brother; COPD in his mother; Cancer in his other; Diabetes in his other; Heart attack in his paternal grandfather and paternal grandmother; Heart failure in his other; Hypertension in his father; Lung cancer in his maternal grandfather. There is no history of Colon cancer.    ROS: All other systems are reviewed and negative. Unless otherwise mentioned in H&P    PHYSICAL EXAM: VS:  BP 144/96 mmHg  Pulse 96   Ht 5\' 8"  (1.727 m)  Wt 224 lb (101.606 kg)  BMI 34.07 kg/m2  SpO2 96% , BMI Body mass index is 34.07 kg/(m^2). GEN: Well nourished, well developed, in no acute distress HEENT: normal Neck: no JVD, carotid bruits, or masses Cardiac: RRR; no murmurs, rubs, or gallops,no edema  Respiratory:  clear to auscultation bilaterally, normal work of breathing GI: soft, nontender, nondistended, + BS MS: no deformity or atrophy Skin: warm and dry, no rash Neuro:  Strength and sensation are intact Psych: euthymic mood, full affect   EKG:The ekg ordered today demonstrates normal sinus rhythm with a heart rate of 84 beats per minute.   Recent Labs: 01/08/2016: ALT 50 02/03/2016: BUN 11; Creatinine, Ser 0.86; Hemoglobin 16.0; Platelets 244; Potassium 4.0; Sodium 132*    Lipid Panel No results found for: CHOL, TRIG, HDL, CHOLHDL, VLDL, LDLCALC, LDLDIRECT    Wt Readings from Last 3 Encounters:  02/20/16 224 lb (101.606 kg)  02/03/16 244 lb (110.678 kg)  01/17/16 243 lb 9.6 oz (110.496 kg)      ASSESSMENT AND PLAN:  1.  Tachycardia:I explained to him that his heart rate can go up and down throughout the day and also increased when he has anxiety attacks. EKG reveals normal sinus rhythm. He has no symptoms of POTS. Will not place him on any rate control medications at this time. No further cardiac testing at this time. Reassurance is again provided.  2. Chronic anxiety:he is encouraged to followup with psychiatry as he has been referred.  Current medicines are reviewed at length with the patient today.    Labs/ tests ordered today include: EKG  Orders Placed This Encounter  Procedures  . EKG 12-Lead     Disposition:   FU with when necessary Signed, Jory Sims, NP  02/20/2016 2:40 PM    [facility] [address] [place], [place] [address] Phone: [phone]; Fax: [phone]

## 2016-02-20 NOTE — Patient Instructions (Signed)
Your physician recommends that you schedule a follow-up appointment As needed.   Your physician recommends that you continue on your current medications as directed. Please refer to the Current Medication list given to you today.  If you need a refill on your cardiac medications before your next appointment, please call your pharmacy.  Thank you for choosing [facility]!

## 2016-02-29 ENCOUNTER — Telehealth (HOSPITAL_COMMUNITY): Payer: Self-pay | Admitting: *Deleted

## 2016-02-29 NOTE — Telephone Encounter (Signed)
left voice message regarding appointment. 

## 2016-03-17 ENCOUNTER — Encounter (HOSPITAL_COMMUNITY): Payer: Self-pay | Admitting: Emergency Medicine

## 2016-03-17 ENCOUNTER — Other Ambulatory Visit: Payer: Self-pay

## 2016-03-17 ENCOUNTER — Emergency Department (HOSPITAL_COMMUNITY)
Admission: EM | Admit: 2016-03-17 | Discharge: 2016-03-17 | Disposition: A | Discharge status: Home / Self Care | Payer: BLUE CROSS/BLUE SHIELD | Attending: Emergency Medicine | Admitting: Emergency Medicine

## 2016-03-17 ENCOUNTER — Emergency Department (HOSPITAL_COMMUNITY): Payer: BLUE CROSS/BLUE SHIELD

## 2016-03-17 DIAGNOSIS — Z87891 Personal history of nicotine dependence: Secondary | ICD-10-CM | POA: Diagnosis not present

## 2016-03-17 DIAGNOSIS — Z79899 Other long term (current) drug therapy: Secondary | ICD-10-CM | POA: Diagnosis not present

## 2016-03-17 DIAGNOSIS — E860 Dehydration: Secondary | ICD-10-CM

## 2016-03-17 DIAGNOSIS — K529 Noninfective gastroenteritis and colitis, unspecified: Secondary | ICD-10-CM | POA: Insufficient documentation

## 2016-03-17 DIAGNOSIS — R197 Diarrhea, unspecified: Secondary | ICD-10-CM | POA: Diagnosis present

## 2016-03-17 LAB — CBC WITH DIFFERENTIAL/PLATELET
BASOS ABS: 0 10*3/uL (ref 0.0–0.1)
Basophils Relative: 0 %
EOS ABS: 0 10*3/uL (ref 0.0–0.7)
EOS PCT: 0 %
HCT: 49.5 % (ref 39.0–52.0)
Hemoglobin: 17.5 g/dL — ABNORMAL HIGH (ref 13.0–17.0)
LYMPHS PCT: 14 %
Lymphs Abs: 1.3 10*3/uL (ref 0.7–4.0)
MCH: 28.9 pg (ref 26.0–34.0)
MCHC: 35.4 g/dL (ref 30.0–36.0)
MCV: 81.7 fL (ref 78.0–100.0)
MONO ABS: 1.1 10*3/uL — AB (ref 0.1–1.0)
Monocytes Relative: 12 %
Neutro Abs: 6.8 10*3/uL (ref 1.7–7.7)
Neutrophils Relative %: 74 %
PLATELETS: 216 10*3/uL (ref 150–400)
RBC: 6.06 MIL/uL — ABNORMAL HIGH (ref 4.22–5.81)
RDW: 13.1 % (ref 11.5–15.5)
WBC: 9.3 10*3/uL (ref 4.0–10.5)

## 2016-03-17 LAB — COMPREHENSIVE METABOLIC PANEL
ALT: 39 U/L (ref 17–63)
AST: 19 U/L (ref 15–41)
Albumin: 4.4 g/dL (ref 3.5–5.0)
Alkaline Phosphatase: 109 U/L (ref 38–126)
Anion gap: 7 (ref 5–15)
BUN: 12 mg/dL (ref 6–20)
CHLORIDE: 103 mmol/L (ref 101–111)
CO2: 25 mmol/L (ref 22–32)
Calcium: 9.2 mg/dL (ref 8.9–10.3)
Creatinine, Ser: 0.84 mg/dL (ref 0.61–1.24)
Glucose, Bld: 104 mg/dL — ABNORMAL HIGH (ref 65–99)
POTASSIUM: 3.9 mmol/L (ref 3.5–5.1)
SODIUM: 135 mmol/L (ref 135–145)
Total Bilirubin: 2.1 mg/dL — ABNORMAL HIGH (ref 0.3–1.2)
Total Protein: 7.2 g/dL (ref 6.5–8.1)

## 2016-03-17 MED ORDER — SODIUM CHLORIDE 0.9 % IV BOLUS (SEPSIS)
2000.0000 mL | Freq: Once | INTRAVENOUS | Status: AC
  Administered 2016-03-17: 2000 mL via INTRAVENOUS

## 2016-03-17 MED ORDER — KETOROLAC TROMETHAMINE 30 MG/ML IJ SOLN
30.0000 mg | Freq: Once | INTRAMUSCULAR | Status: AC
  Administered 2016-03-17: 30 mg via INTRAVENOUS
  Filled 2016-03-17: qty 1

## 2016-03-17 MED ORDER — ONDANSETRON HCL 4 MG/2ML IJ SOLN
4.0000 mg | Freq: Once | INTRAMUSCULAR | Status: AC
  Administered 2016-03-17: 4 mg via INTRAVENOUS
  Filled 2016-03-17: qty 2

## 2016-03-17 MED ORDER — DICYCLOMINE HCL 20 MG PO TABS: ORAL_TABLET | ORAL | Status: DC

## 2016-03-17 MED ORDER — ONDANSETRON 4 MG PO TBDP: ORAL_TABLET | ORAL | Status: DC

## 2016-03-17 NOTE — ED Provider Notes (Signed)
CSN: [id number]     Arrival date & time 03/17/16  1533 History   First MD Initiated Contact with Patient 03/17/16 1557     Chief Complaint  Patient presents with  . Chest Pain     (Consider location/radiation/quality/duration/timing/severity/associated sxs/prior Treatment) Patient is a 25 y.o. male presenting with diarrhea. The history is provided by the patient (The patient complains of diarrhea and nausea and abdominal cramping for couple days).  Diarrhea Quality:  Semi-solid Severity:  Moderate Onset quality:  Sudden Timing:  Constant Progression:  Improving Relieved by:  Nothing Worsened by:  Nothing tried Associated symptoms: abdominal pain   Associated symptoms: no headaches     Past Medical History  Diagnosis Date  . Migraines   . SVT (supraventricular tachycardia) ([place])   . Anxiety   . Sinus tachycardia ([place])     r/t anxiety  . GERD (gastroesophageal reflux disease)   . Hiatal hernia    Past Surgical History  Procedure Laterality Date  . Electrophysiologic study N/A 06/28/2015    Procedure: SVT Ablation;  Surgeon: Evans Lance, MD;  Location: [facility];  Service: Cardiovascular;  Laterality: N/A;  . Ablation of dysrhythmic focus  06/28/2015    SVT  . Esophagogastroduodenoscopy (egd) with propofol N/A 01/22/2016    Procedure: ESOPHAGOGASTRODUODENOSCOPY (EGD) WITH PROPOFOL;  Surgeon: Daneil Dolin, MD;  [facility];  Service: Endoscopy;  Laterality: N/A;  7:30  . Maloney dilation N/A 01/22/2016    Procedure: Venia Minks DILATION;  Surgeon: Daneil Dolin, MD;  [facility];  Service: Endoscopy;  Laterality: N/A;   Family History  Problem Relation Age of Onset  . Diabetes Other   . Cancer Other   . Heart failure Other   . COPD Mother   . Hypertension Father   . Lung cancer Maternal Grandfather   . Heart attack Paternal Grandmother   . Heart attack Paternal Grandfather   . Autism Brother   . Autism Brother   . Colon cancer Neg Hx    . Gallbladder disease      multiple family members   Social History  Substance Use Topics  . Smoking status: Former Smoker -- 0.25 packs/day for 6 years    Types: Cigarettes    Start date: 12/01/2007    Quit date: 03/23/2014  . Smokeless tobacco: Former Systems developer    Types: [place] date: 09/19/2014  . Alcohol Use: No    Review of Systems  Constitutional: Negative for appetite change and fatigue.  HENT: Negative for congestion, ear discharge and sinus pressure.   Eyes: Negative for discharge.  Respiratory: Negative for cough.   Cardiovascular: Negative for chest pain.  Gastrointestinal: Positive for nausea, abdominal pain and diarrhea.  Genitourinary: Negative for frequency and hematuria.  Musculoskeletal: Negative for back pain.  Skin: Negative for rash.  Neurological: Negative for seizures and headaches.  Psychiatric/Behavioral: Negative for hallucinations.      Allergies  Review of patient's allergies indicates no known allergies.  Home Medications   Prior to Admission medications   Medication Sig Start Date End Date Taking? Authorizing Provider  ALPRAZolam [name]) 1 MG tablet Take 1 mg by mouth 2 (two) times daily.    Yes Historical Provider, MD  DEXILANT 60 MG capsule Take 60 mg by mouth daily. 01/22/16  Yes Historical Provider, MD  FLUoxetine (PROZAC) 20 MG capsule Take 20 mg by mouth daily.   Yes Historical Provider, MD  ibuprofen (ADVIL,MOTRIN) 800 MG tablet  Take 800 mg by mouth every 8 (eight) hours as needed for mild pain or moderate pain.    Yes Historical Provider, MD  Potassium 99 MG TABS Take 99 mg by mouth daily.    Yes Historical Provider, MD  dicyclomine (BENTYL) 20 MG tablet Take one every 6-8h for abd cramps 03/17/16   Milton Ferguson, MD  ondansetron (ZOFRAN ODT) 4 MG disintegrating tablet 4mg  ODT q4 hours prn nausea/vomit 03/17/16   Milton Ferguson, MD   BP 115/72 mmHg  Pulse 90  Temp(Src) 98.3 F (36.8 C) (Oral)  Resp 0  Ht 5\' 8"  (1.727 m)  Wt 240 lb  (108.863 kg)  BMI 36.50 kg/m2  SpO2 99% Physical Exam  Constitutional: He is oriented to person, place, and time. He appears well-developed.  HENT:  Head: Normocephalic.  Eyes: Conjunctivae and EOM are normal. No scleral icterus.  Neck: Neck supple. No thyromegaly present.  Cardiovascular: Normal rate and regular rhythm.  Exam reveals no gallop and no friction rub.   No murmur heard. Pulmonary/Chest: No stridor. He has no wheezes. He has no rales. He exhibits no tenderness.  Abdominal: He exhibits no distension. There is tenderness. There is no rebound.  Musculoskeletal: Normal range of motion. He exhibits no edema.  Lymphadenopathy:    He has no cervical adenopathy.  Neurological: He is oriented to person, place, and time. He exhibits normal muscle tone. Coordination normal.  Skin: No rash noted. No erythema.  Psychiatric: He has a normal mood and affect. His behavior is normal.    ED Course  Procedures (including critical care time) Labs Review Labs Reviewed  CBC WITH DIFFERENTIAL/PLATELET - Abnormal; Notable for the following:    RBC 6.06 (*)    Hemoglobin 17.5 (*)    Monocytes Absolute 1.1 (*)    All other components within normal limits  COMPREHENSIVE METABOLIC PANEL - Abnormal; Notable for the following:    Glucose, Bld 104 (*)    Total Bilirubin 2.1 (*)    All other components within normal limits    Imaging Review Dg Abd Acute W/chest  03/17/2016  CLINICAL DATA:  Abdominal pain, nausea and diarrhea since yesterday. EXAM: DG ABDOMEN ACUTE W/ 1V CHEST COMPARISON:  Chest x-ray 02/03/2016 FINDINGS: The chest x-ray is normal. No acute pulmonary findings. No pleural effusion. Two views of the abdomen demonstrate scattered air and some stool in the colon. No distended air-filled loops of small bowel to suggest obstruction. No free air. The soft tissue shadows are maintained. No worrisome calcifications. The bony structures are intact. IMPRESSION: No acute cardiopulmonary  findings. No plain film findings for an acute abdominal process. Electronically Signed   By: Marijo Sanes M.D.   On: 03/17/2016 18:22   I have personally reviewed and evaluated these images and lab results as part of my medical decision-making.   EKG Interpretation None      MDM   Final diagnoses:  Gastroenteritis  Dehydration    Labs EKG abdominal series unremarkable. Patient given IV fluids and tachycardia improved. Diagnosis gastroenteritis with dehydration. Patient sent home on Zofran and Bentyl follow-up with his PCP    Milton Ferguson, MD 03/17/16 1954

## 2016-03-17 NOTE — Discharge Instructions (Signed)
Drink plenty of fluids and follow up if not improving. °

## 2016-03-17 NOTE — ED Notes (Signed)
Pt reports stomach pain, nausea, diarrhea and constipation that started last night.  Today started experiencing cp.  Hx of svt and ablation.

## 2016-03-21 ENCOUNTER — Encounter: Payer: Self-pay | Admitting: Adult Health

## 2016-03-21 ENCOUNTER — Encounter: Payer: Self-pay | Admitting: Cardiovascular Disease

## 2016-03-21 ENCOUNTER — Encounter: Payer: Self-pay | Admitting: Internal Medicine

## 2016-03-22 ENCOUNTER — Telehealth: Payer: Self-pay | Admitting: *Deleted

## 2016-03-22 NOTE — Telephone Encounter (Signed)
Pt aware and appreciative  

## 2016-03-22 NOTE — Telephone Encounter (Signed)
Pt says having on and off CP - denies CP at this time - concerned since at ED EKG interpretation said abnormal. Pt also has hiatal hernia which is probably cause of occasional CP. Is going to the beach and wanted to make sure he didn't need to be seen. Will forward to Dr. Bronson Ing

## 2016-03-22 NOTE — Telephone Encounter (Signed)
No need to be seen

## 2016-03-27 NOTE — Telephone Encounter (Signed)
Dr Lovena Le states there is no need to be seen in the office.

## 2016-04-08 ENCOUNTER — Ambulatory Visit (INDEPENDENT_AMBULATORY_CARE_PROVIDER_SITE_OTHER): Payer: BLUE CROSS/BLUE SHIELD | Admitting: Psychology

## 2016-04-08 DIAGNOSIS — F4001 Agoraphobia with panic disorder: Secondary | ICD-10-CM | POA: Diagnosis not present

## 2016-04-08 DIAGNOSIS — F411 Generalized anxiety disorder: Secondary | ICD-10-CM
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
## 2016-04-23 ENCOUNTER — Ambulatory Visit (INDEPENDENT_AMBULATORY_CARE_PROVIDER_SITE_OTHER): Payer: BLUE CROSS/BLUE SHIELD | Admitting: Gastroenterology

## 2016-04-23 ENCOUNTER — Encounter: Payer: Self-pay | Admitting: Gastroenterology

## 2016-04-23 VITALS — BP 127/81 | HR 70 | Temp 97.5°F | Ht 68.0 in | Wt 237.2 lb

## 2016-04-23 DIAGNOSIS — K219 Gastro-esophageal reflux disease without esophagitis: Secondary | ICD-10-CM

## 2016-04-23 NOTE — Patient Instructions (Signed)
Continue taking Dexilant once a day.   We will see you in 2 years.   Please call with any problems!

## 2016-04-26 ENCOUNTER — Ambulatory Visit (INDEPENDENT_AMBULATORY_CARE_PROVIDER_SITE_OTHER): Payer: BLUE CROSS/BLUE SHIELD | Admitting: Psychology

## 2016-04-26 ENCOUNTER — Encounter (HOSPITAL_COMMUNITY): Payer: Self-pay | Admitting: Psychology

## 2016-04-26 DIAGNOSIS — F4001 Agoraphobia with panic disorder: Secondary | ICD-10-CM

## 2016-04-26 DIAGNOSIS — F411 Generalized anxiety disorder: Secondary | ICD-10-CM

## 2016-04-26 NOTE — Progress Notes (Signed)
Patient:  Samuel Blackwell   DOB: [birth year]  MR Number: [id number]  Location: [facility] [address] [place] [address] Dept: [phone]  Start: 8 AM  End: 9 AM  Provider/Observer:     Edgardo Roys PSYD  Chief Complaint:      Chief Complaint  Patient presents with  . Panic Attack  . Stress  . Anxiety    Reason For Service:   The patient reports that he was referred for psychotherapeutic and psychiatric care due to severe anxiety and ongoing panic attacks. He reports that the major stressors that he is dealing with right now have to do with work stress as well as his health concerns. The patient reports that he has been diagnosed with supraventricular tachycardia and has had heart rates measured as high as 250. He was treated to cardiology and had SVT ablation surgery conducted where 2 of the 3 pathways that he had were removed. The patient reports that he has not had any of these events since the had the surgery but he is constantly worrying about what will happen if he has a severe Tachycardia events again. The patient is very hypervigilant about this issue. The patient reports that he is currently worried about something happening cardiovascularly. He reports that he will not leave the area as he is fearful of being away from his doctors either at [facility] in the emergency department or his cardiologist. He had the ablation surgery conducted in October. The patient reports that he had been going to the gym after the surgery and started doing better but then he had an event where 2 of his friends died started worrying again. After that he had his wisdom teeth removed and began going online looking at medical issues and sought report where a young woman had a cardiovascular event and died while having her wisdom teeth removed. He reports things like this set him off.  The patient  reports that he currently experiences significant symptoms related to anxiety, mood changes, sleep disturbance, work problems, excessive worrying, panic attacks, obsessive thinking, and poor concentration. He reports that when he is having a panic attack he will have a racing heart beat and begins to have overwhelming fears that he is dying. He reports that he is constantly worried. 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 However, he would have panic attacks and begin fearful that something would happen when he was in charge and took a demotion at work so he would not have this pressure/stress. The patient reports that the effects of these issues effectively left him at home most of the time and fearful of being around crowds of people are being away from his healthcare providers.  The patient actually was seen in our office and I saw him personally when he was an adolescent. The patient acknowledges remembering these events and states that he had been a very angry young man but did not have any significant anxiety and certainly had no panic attacks at that time. The patient reports that his mother was very difficult to live with at the time his father was not around him but his father is become an important part of his life in adulthood. The patient has a significant family history for severe anxiety including his mother, father, maternal grandmother.   Interventions Strategy:  Cognitive  Behavioral Therapeutic interventions  Participation Level:   Active  Participation Quality:  Appropriate      Behavioral Observation:  Well Groomed, Alert, and Appropriate.   Current Psychosocial Factors: The patient reports that he has been actively working on the initial interventions we developed during first visit.  He has also been working on improving diet, getting more physical activity and having good sleep pattern.  He has  worked on being around others that he feels comfortable with as much as he can and not being at home by himself.   Content of Session:   Reviewed current symptoms and situations.  Worked on treatment goals.  Current Status:   The patient reports that he has had reduction in anxiety and frequency of panic events.  He reports continued anxiety but improvement.  He reports that he has continued with the Prozac and now the initial side effects have stopped and he feels like it is helping as well.  The patient is sleeping better and doing sleep hygiene techniques.  We have started working on systematic desensitation.  He is still not ready to return to work as he his still having panic events.  I feel like he should be at a point int he next 3 weeks or so to return to work.  Patient Progress:   Good.  Active participation and fully engaged in process of treatment.  Target Goals:   Reduce intensity, severity, and duration of panic events and general anxiety levels.  Last Reviewed:   04/26/2016  Goals Addressed Today:    Worked on coping skills around panic attacks and agoraphobia  Impression/Diagnosis:  The patient reports significant anxiety starting and young adulthood and has progressively worsened. The patient had some panic attacks but also had significant medical issues that contributed to worsening of his condition. He was diagnosed with supraventricular tachycardia and had ablative surgery in October. He reports that he has not had any other major tachycardic events related to the electrical signals to his heart but is in constant fear that something will happen cardiovascularly and he will die. The patient has been having ongoing increases in heart rate and blood pressure during these events that are ongoing today. He has developed mild to moderate issues related to agoraphobia.   Diagnosis:    Axis I: Panic disorder with agoraphobia and severe panic attacks  Generalized anxiety  disorder        RODENBOUGH,JOHN R, PsyD 04/26/2016

## 2016-04-28 ENCOUNTER — Encounter: Payer: Self-pay | Admitting: Gastroenterology

## 2016-04-28 NOTE — Progress Notes (Signed)
Referring Provider: Lanelle Bal, PA-C Primary Care Physician:  Tonye Becket  Primary GI: Dr. Gala Romney   Chief Complaint  Patient presents with  . Follow-up    doing good    HPI:   Samuel Blackwell is a 25 y.o. male presenting today with a history of GERD, abdominal pain, US abdomen normal. EGD without Barrett's, small hiatal hernia. He returns today in follow-up from EGD. Taking Dexilant. Doing well without abdominal pain. No N/V. Tolerating diet. No GI concerns today. Occasional reflux concerns but overall much improved from last visit.   Past Medical History:  Diagnosis Date  . Anxiety   . GERD (gastroesophageal reflux disease)   . Hiatal hernia   . Migraines   . Sinus tachycardia ([place])    r/t anxiety  . SVT (supraventricular tachycardia) ([facility])     Past Surgical History:  Procedure Laterality Date  . ABLATION OF DYSRHYTHMIC FOCUS  06/28/2015   SVT  . ELECTROPHYSIOLOGIC STUDY N/A 06/28/2015   Procedure: SVT Ablation;  Surgeon: Evans Lance, MD;  Location: [facility];  Service: Cardiovascular;  Laterality: N/A;  . ESOPHAGOGASTRODUODENOSCOPY (EGD) WITH PROPOFOL N/A 01/22/2016   Dr. Gala Romney: negative Barrett's, small hiatal hernia  . MALONEY DILATION N/A 01/22/2016   Procedure: Venia Minks DILATION;  Surgeon: Daneil Dolin, MD;  Location: [facility];  Service: Endoscopy;  Laterality: N/A;    Current Outpatient Prescriptions  Medication Sig Dispense Refill  . ALPRAZolam (XANAX) 1 MG tablet Take 1 mg by mouth 2 (two) times daily.     Marland Kitchen DEXILANT 60 MG capsule Take 60 mg by mouth daily.    Marland Kitchen FLUoxetine (PROZAC) 20 MG capsule Take 20 mg by mouth daily.    Marland Kitchen ibuprofen (ADVIL,MOTRIN) 800 MG tablet Take 800 mg by mouth every 8 (eight) hours as needed for mild pain or moderate pain.      No current facility-administered medications for this visit.     Allergies as of 04/23/2016  . (No Known Allergies)    Family History  Problem Relation Age of Onset  . COPD  Mother   . Hypertension Father   . Lung cancer Maternal Grandfather   . Heart attack Paternal Grandmother   . Heart attack Paternal Grandfather   . Autism Brother   . Autism Brother   . Diabetes Other   . Cancer Other   . Heart failure Other   . Gallbladder disease      multiple family members  . Colon cancer Neg Hx     Social History   Social History  . Marital status: Married    Spouse name: N/A  . Number of children: N/A  . Years of education: N/A   Occupational History  . [employer]    Social History Main Topics  . Smoking status: Former Smoker    Packs/day: 0.25    Years: 6.00    Types: Cigarettes    Start date: 12/01/2007    Quit date: 03/23/2014  . Smokeless tobacco: Former Systems developer    Types: [place] date: 09/19/2014  . Alcohol use No  . Drug use: No  . Sexual activity: Yes    Partners: Female   Other Topics Concern  . None   Social History Narrative  . None    Review of Systems: Negative unless mentioned in HPI.   Physical Exam: BP 127/81   Pulse 70   Temp 97.5 F (36.4 C) (Oral)  Ht 5\' 8"  (1.727 m)   Wt 237 lb 3.2 oz (107.6 kg)   BMI 36.07 kg/m  General:   Alert and oriented. No distress noted. Pleasant and cooperative.  Head:  Normocephalic and atraumatic. Abdomen:  +BS, soft, non-tender and non-distended. No rebound or guarding. No HSM or masses noted. Extremities:  Without edema. Neurologic:  Alert and  oriented x4;  grossly normal neurologically. Psych:  Alert and cooperative. Normal mood and affect.

## 2016-04-28 NOTE — Assessment & Plan Note (Signed)
Continue Dexilant once daily. We will see you in 2 years or sooner if needed.

## 2016-04-29 NOTE — Progress Notes (Signed)
CC'ED TO PCP 

## 2016-04-30 ENCOUNTER — Ambulatory Visit (INDEPENDENT_AMBULATORY_CARE_PROVIDER_SITE_OTHER): Payer: BLUE CROSS/BLUE SHIELD | Admitting: Psychiatry

## 2016-04-30 ENCOUNTER — Ambulatory Visit (HOSPITAL_COMMUNITY): Payer: Self-pay | Admitting: Psychiatry

## 2016-04-30 ENCOUNTER — Encounter (HOSPITAL_COMMUNITY): Payer: Self-pay | Admitting: Psychiatry

## 2016-04-30 ENCOUNTER — Encounter (HOSPITAL_COMMUNITY): Payer: Self-pay

## 2016-04-30 VITALS — BP 134/76 | HR 75 | Ht 68.0 in | Wt 240.6 lb

## 2016-04-30 DIAGNOSIS — F4001 Agoraphobia with panic disorder: Secondary | ICD-10-CM | POA: Diagnosis not present

## 2016-04-30 MED ORDER — FLUOXETINE HCL 20 MG PO CAPS: 20.0000 mg | ORAL_CAPSULE | Freq: Every day | ORAL | 2 refills | Status: DC

## 2016-04-30 NOTE — Progress Notes (Signed)
Psychiatric Initial Adult Assessment   Patient Identification: Samuel Blackwell MRN:  [id number] Date of Evaluation:  04/30/2016 Referral Source: [facility], psychologist Chief Complaint:   Chief Complaint    Anxiety; Establish Care; Follow-up     Visit Diagnosis:    ICD-9-CM ICD-10-CM   1. Panic disorder with agoraphobia and moderate panic attacks 300.21 F40.01     History of Present Illness:  This patient is a 25 year old married white male who lives with his wife in [place]. 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  The patient was [facility] to our office. He has been seen by [facility] psychologist in our office was since then referred him to me. The patient has a history of anxiety with severe panic attacks and agoraphobia.  The patient states that he is always been an anxious person. He had a rather difficult childhood. His parents split up when he was 52 years old and he lived with his mother. She moved around a lot and he attended 6 elementary schools. She was also verbally abusive towards him and they argued constantly. At age 59 he moved in with his father. During high school he drank a lot of alcohol and even blacked out several times. He has been sober for 2 years from both alcohol and marijuana.  In December 2015 he had a severe episode of tachycardia at work. He was brought to the ED and it was reversed. He began getting very nervous and panicky since then. He was eventually diagnosed with SVT and had a cardiac ablation done in October 2016. Even after the ablation he is constantly worried about his heart rate going to high or other medical problems. He had a small lump in his stomach which he thought was a hernia but was found to just be a fatty deposit. He also has developed a hiatal hernia and is under excellent. He is worried a good deal about his health. He worries about dying and about things  that have an even happened yet. For while he is a very difficult time leaving his house or even going to relative's homes.  In May his family doctor put him on Prozac. It really didn't help for about the first 4-6 weeks but over the last month or so he's felt some improvement. He is also on Xanax 1 mg twice a day which is helped his anxiety. He is started to get out and do things with his wife. He is still has some trouble being on his own. He still gets somewhat nervous out in crowds among people. He has been on medical leave from work and is supposed to return on August 28 and he said things he will be ready. Unfortunately some of his severe panic attacks have happened at work and he is associated the setting with the attacks.  The patient denies being depressed or having suicidal ideation his energy and appetite are good. He is pulled a muscle in his back which has affected his sleep.   Associated Signs/Symptoms: Depression Symptoms:  psychomotor agitation, feelings of worthlessness/guilt, difficulty concentrating, (Hypo) Manic Symptoms:  none Anxiety Symptoms:  Agoraphobia, Excessive Worry, Panic Symptoms, Social Anxiety,   Past Psychiatric History: This is the patient's first time seeing a psychiatrist. He saw Dr. Jefm Miles in the past when he was a teenager and having a lot of conflicts with his mother. He recently started seeing him again  to help deal with the panic attacks  Previous Psychotropic Medications: Yes   Substance Abuse History in the last 12 months:  No.  Consequences of Substance Abuse: NA  Past Medical History:  Past Medical History:  Diagnosis Date  . Anxiety   . GERD (gastroesophageal reflux disease)   . Hiatal hernia   . Migraines   . Sinus tachycardia ([facility])    r/t anxiety  . SVT (supraventricular tachycardia) ([facility])     Past Surgical History:  Procedure Laterality Date  . ABLATION OF DYSRHYTHMIC FOCUS  06/28/2015   SVT  . ELECTROPHYSIOLOGIC STUDY N/A  06/28/2015   Procedure: SVT Ablation;  Surgeon: Evans Lance, MD;  Location: [facility];  Service: Cardiovascular;  Laterality: N/A;  . ESOPHAGOGASTRODUODENOSCOPY (EGD) WITH PROPOFOL N/A 01/22/2016   Dr. Gala Romney: negative Barrett's, small hiatal hernia  . MALONEY DILATION N/A 01/22/2016   Procedure: Venia Minks DILATION;  Surgeon: Daneil Dolin, MD;  [facility];  Service: Endoscopy;  Laterality: N/A;    Family Psychiatric History: Both parents have depression and anxiety. The mother has had 3 younger children and 58 57 year old twin brothers both are autistic. He has several cousins who also have depression and panic disorder  Family History:  Family History  Problem Relation Age of Onset  . COPD Mother   . Depression Mother   . Anxiety disorder Mother   . Hypertension Father   . Depression Father   . Anxiety disorder Father   . Lung cancer Maternal Grandfather   . Heart attack Paternal Grandmother   . Heart attack Paternal Grandfather   . Autism Brother   . Autism Brother   . Diabetes Other   . Cancer Other   . Heart failure Other   . Gallbladder disease      multiple family members  . Anxiety disorder Cousin   . Drug abuse Cousin   . Colon cancer Neg Hx     Social History:   Social History   Social History  . Marital status: Married    Spouse name: N/A  . Number of children: N/A  . Years of education: N/A   Occupational History  . [employer]    Social History Main Topics  . Smoking status: Former Smoker    Packs/day: 0.25    Years: 6.00    Types: Cigarettes    Start date: 12/01/2007    Quit date: 03/23/2014  . Smokeless tobacco: Former Systems developer    Types: [place] date: 09/19/2014  . Alcohol use No     Comment: 04-30-16 per pt no but use to  . Drug use: No     Comment: 04-30-16 per pt no  . Sexual activity: Yes    Partners: Female   Other Topics Concern  . None   Social History Narrative  . None    Additional Social History: The patient grew  up primarily in the [place] area. His parents divorced when he was 72 years old and he lived with his mother. His mother moved them around a lot and he attended numerous schools. He states that she was verbally abusive. He denies any history of physical or sexual abuse. He completed high school but drank a lot all throughout high school. He quit drinking 2 years ago. He has been married for one year and states that his wife is very supportive as is her family. He has worked at [employer] for 4 years  Allergies:  No Known Allergies  Metabolic Disorder Labs: No results found for: HGBA1C, MPG No results found for: PROLACTIN No results found for: CHOL, TRIG, HDL, CHOLHDL, VLDL, LDLCALC   Current Medications: Current Outpatient Prescriptions  Medication Sig Dispense Refill  . ALPRAZolam (XANAX) 1 MG tablet Take 1 mg by mouth 2 (two) times daily.     Marland Kitchen DEXILANT 60 MG capsule Take 60 mg by mouth daily.    Marland Kitchen FLUoxetine (PROZAC) 20 MG capsule Take 1 capsule (20 mg total) by mouth daily. 30 capsule 2  . ibuprofen (ADVIL,MOTRIN) 800 MG tablet Take 800 mg by mouth every 8 (eight) hours as needed for mild pain or moderate pain.      No current facility-administered medications for this visit.     Neurologic: Headache: No Seizure: No Paresthesias:No  Musculoskeletal: Strength & Muscle Tone: within normal limits Gait & Station: normal Patient leans: N/A  Psychiatric Specialty Exam: ROS  Blood pressure 134/76, pulse 75, height 5\' 8"  (1.727 m), weight 240 lb 9.6 oz (109.1 kg), SpO2 97 %.Body mass index is 36.58 kg/m.  General Appearance: Casual and Fairly Groomed  Eye Contact:  Good  Speech:  Clear and Coherent  Volume:  Normal  Mood:  Anxious  Affect:  Congruent  Thought Process:  Goal Directed  Orientation:  Full (Time, Place, and Person)  Thought Content:  Obsessions and Rumination  Suicidal Thoughts:  No  Homicidal Thoughts:  No  Memory:  Immediate;   Fair Recent;   Fair Remote;    Fair  Judgement:  Good  Insight:  Fair  Psychomotor Activity:  Normal  Concentration:  Concentration: Good and Attention Span: Good  Recall:  Good  Fund of Knowledge:Good  Language: Good  Akathisia:  No  Handed:  Right  AIMS (if indicated):    Assets:  Communication Skills Desire for Improvement Physical Health Resilience Social Support  ADL's:  Intact  Cognition: WNL  Sleep:  ok    Treatment Plan Summary: Medication management  This patient is a 25 year old white male with a history of significant anxiety, panic attacks and agoraphobia. This primarily seems just started since the onset of the SVT. He states that he is having a good response to Prozac. I suggested increasing it since he still has some worries and ruminations but he would like to remain at the current dose of 20 mg daily. He also feels that the Xanax 1 mg twice a day has helped his panic attacks. In any case I would like him to return in 4 weeks to see how he is adjusted to his return to the workplace. He will continue his counseling with Dr. Tommie Ard, Neoma Laming, MD 8/8/201711:31 AM

## 2016-05-09 ENCOUNTER — Telehealth (HOSPITAL_COMMUNITY): Payer: Self-pay | Admitting: *Deleted

## 2016-05-14 ENCOUNTER — Ambulatory Visit (HOSPITAL_COMMUNITY): Payer: BLUE CROSS/BLUE SHIELD | Admitting: Psychology

## 2016-05-29 ENCOUNTER — Telehealth (HOSPITAL_COMMUNITY): Payer: Self-pay | Admitting: *Deleted

## 2016-05-29 ENCOUNTER — Encounter (HOSPITAL_COMMUNITY): Payer: Self-pay | Admitting: Psychology

## 2016-05-29 NOTE — Telephone Encounter (Signed)
Pt called stating his disability office will be faxing over form for Dr. Sima Blackwell to fill out and he have 10 days to fill it out and fax back to them.

## 2016-05-29 NOTE — Telephone Encounter (Signed)
phone call from patient, he would like a letter from Dr. Sima Matas.  He is returning to work.  Each time he has a panic attack it is during the time he closes.   He wants a letter stating that closing is contributing to his panic attacks, and he should not close.

## 2016-05-31 ENCOUNTER — Telehealth: Payer: Self-pay | Admitting: Internal Medicine

## 2016-05-31 ENCOUNTER — Encounter (HOSPITAL_COMMUNITY): Payer: Self-pay | Admitting: Psychiatry

## 2016-05-31 ENCOUNTER — Ambulatory Visit (INDEPENDENT_AMBULATORY_CARE_PROVIDER_SITE_OTHER): Payer: BLUE CROSS/BLUE SHIELD | Admitting: Psychiatry

## 2016-05-31 VITALS — BP 137/79 | HR 76 | Ht 68.0 in | Wt 252.8 lb

## 2016-05-31 DIAGNOSIS — F419 Anxiety disorder, unspecified: Secondary | ICD-10-CM

## 2016-05-31 DIAGNOSIS — F4001 Agoraphobia with panic disorder: Secondary | ICD-10-CM

## 2016-05-31 MED ORDER — ALPRAZOLAM 1 MG PO TABS: 1.0000 mg | ORAL_TABLET | Freq: Three times a day (TID) | ORAL | 2 refills | Status: AC | PRN

## 2016-05-31 MED ORDER — FLUOXETINE HCL 20 MG PO CAPS: 20.0000 mg | ORAL_CAPSULE | Freq: Every day | ORAL | 2 refills | Status: AC

## 2016-05-31 NOTE — Progress Notes (Signed)
Psychiatric Initial Adult Assessment   Patient Identification: Samuel Blackwell MRN:  [id number] Date of Evaluation:  05/31/2016 Referral Source: [facility], psychologist Chief Complaint:   Chief Complaint    Anxiety; Depression; Follow-up     Visit Diagnosis:    ICD-9-CM ICD-10-CM   1. Panic disorder with agoraphobia and moderate panic attacks 300.21 F40.01   2. Anxiety 300.00 F41.9 ALPRAZolam (XANAX) 1 MG tablet    History of Present Illness:  This patient is a 25 year old married white male who lives with his wife in [place]. 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  The patient was [facility] to our office. He has been seen by [facility] psychologist in our office was since then referred him to me. The patient has a history of anxiety with severe panic attacks and agoraphobia.  The patient states that he is always been an anxious person. He had a rather difficult childhood. His parents split up when he was 22 years old and he lived with his mother. She moved around a lot and he attended 6 elementary schools. She was also verbally abusive towards him and they argued constantly. At age 66 he moved in with his father. During high school he drank a lot of alcohol and even blacked out several times. He has been sober for 2 years from both alcohol and marijuana.  In December 2015 he had a severe episode of tachycardia at work. He was brought to the ED and it was reversed. He began getting very nervous and panicky since then. He was eventually diagnosed with SVT and had a cardiac ablation done in October 2016. Even after the ablation he is constantly worried about his heart rate going to high or other medical problems. He had a small lump in his stomach which he thought was a hernia but was found to just be a fatty deposit. He also has developed a hiatal hernia and is under excellent. He is worried a good deal about  his health. He worries about dying and about things that have an even happened yet. For while he is a very difficult time leaving his house or even going to relative's homes.  In May his family doctor put him on Prozac. It really didn't help for about the first 4-6 weeks but over the last month or so he's felt some improvement. He is also on Xanax 1 mg twice a day which is helped his anxiety. He is started to get out and do things with his wife. He is still has some trouble being on his own. He still gets somewhat nervous out in crowds among people. He has been on medical leave from work and is supposed to return on August 28 and he said things he will be ready. Unfortunately some of his severe panic attacks have happened at work and he is associated the setting with the attacks.  The patient denies being depressed or having suicidal ideation his energy and appetite are good. He is pulled a muscle in his back which has affected his sleep.   The patient returns after 4 weeks. In general he is doing somewhat better. He is not having the severe episodes of tachycardia. He now tells me supposed to return to work on September 18. 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 He finds that sometimes he needs the Xanax 3 times a day and I told him we could go up to this but this would be the endpoint and he understands. He denies suicidal ideation and he is sleeping well  Associated Signs/Symptoms: Depression Symptoms:  psychomotor agitation, feelings of worthlessness/guilt, difficulty concentrating, (Hypo) Manic Symptoms:  none Anxiety Symptoms:  Agoraphobia, Excessive Worry, Panic Symptoms, Social Anxiety,   Past Psychiatric History: This is the patient's first time seeing a psychiatrist. He saw Dr. Jefm Miles in the past when he was a teenager and having a lot of conflicts with his mother. He recently  started seeing him again to help deal with the panic attacks  Previous Psychotropic Medications: Yes   Substance Abuse History in the last 12 months:  No.  Consequences of Substance Abuse: NA  Past Medical History:  Past Medical History:  Diagnosis Date  . Anxiety   . GERD (gastroesophageal reflux disease)   . Hiatal hernia   . Migraines   . Sinus tachycardia ([place])    r/t anxiety  . SVT (supraventricular tachycardia) ([facility])     Past Surgical History:  Procedure Laterality Date  . ABLATION OF DYSRHYTHMIC FOCUS  06/28/2015   SVT  . ELECTROPHYSIOLOGIC STUDY N/A 06/28/2015   Procedure: SVT Ablation;  Surgeon: Evans Lance, MD;  Location: [facility];  Service: Cardiovascular;  Laterality: N/A;  . ESOPHAGOGASTRODUODENOSCOPY (EGD) WITH PROPOFOL N/A 01/22/2016   Dr. Gala Romney: negative Barrett's, small hiatal hernia  . MALONEY DILATION N/A 01/22/2016   Procedure: Venia Minks DILATION;  Surgeon: Daneil Dolin, MD;  [facility];  Service: Endoscopy;  Laterality: N/A;    Family Psychiatric History: Both parents have depression and anxiety. The mother has had 3 younger children and 78 66 year old twin brothers both are autistic. He has several cousins who also have depression and panic disorder  Family History:  Family History  Problem Relation Age of Onset  . COPD Mother   . Depression Mother   . Anxiety disorder Mother   . Hypertension Father   . Depression Father   . Anxiety disorder Father   . Lung cancer Maternal Grandfather   . Heart attack Paternal Grandmother   . Heart attack Paternal Grandfather   . Autism Brother   . Autism Brother   . Diabetes Other   . Cancer Other   . Heart failure Other   . Gallbladder disease      multiple family members  . Anxiety disorder Cousin   . Drug abuse Cousin   . Colon cancer Neg Hx     Social History:   Social History   Social History  . Marital status: Married    Spouse name: N/A  . Number of children: N/A  .  Years of education: N/A   Occupational History  . [employer]    Social History Main Topics  . Smoking status: Former Smoker    Packs/day: 0.25    Years: 6.00    Types: Cigarettes    Start date: 12/01/2007    Quit date: 03/23/2014  . Smokeless tobacco: Former Systems developer    Types: [place] date: 09/19/2014  . Alcohol use No     Comment: 04-30-16 per pt no but use to  . Drug use: No     Comment: 04-30-16 per pt no  . Sexual activity: Yes    Partners: Female   Other Topics Concern  . None  Social History Narrative  . None    Additional Social History: The patient grew up primarily in the [place] area. His parents divorced when he was 51 years old and he lived with his mother. His mother moved them around a lot and he attended numerous schools. He states that she was verbally abusive. He denies any history of physical or sexual abuse. He completed high school but drank a lot all throughout high school. He quit drinking 2 years ago. He has been married for one year and states that his wife is very supportive as is her family. He has worked at [employer] for 4 years  Allergies:  No Known Allergies  Metabolic Disorder Labs: No results found for: HGBA1C, MPG No results found for: PROLACTIN No results found for: CHOL, TRIG, HDL, CHOLHDL, VLDL, LDLCALC   Current Medications: Current Outpatient Prescriptions  Medication Sig Dispense Refill  . ALPRAZolam (XANAX) 1 MG tablet Take 1 tablet (1 mg total) by mouth 3 (three) times daily as needed for anxiety. 90 tablet 2  . DEXILANT 60 MG capsule Take 60 mg by mouth daily.    Marland Kitchen FLUoxetine (PROZAC) 20 MG capsule Take 1 capsule (20 mg total) by mouth daily. 30 capsule 2  . ibuprofen (ADVIL,MOTRIN) 800 MG tablet Take 800 mg by mouth every 8 (eight) hours as needed for mild pain or moderate pain.      No current facility-administered medications for this visit.     Neurologic: Headache: No Seizure: No Paresthesias:No  Musculoskeletal: Strength &  Muscle Tone: within normal limits Gait & Station: normal Patient leans: N/A  Psychiatric Specialty Exam: ROS  Blood pressure 137/79, pulse 76, height 5\' 8"  (1.727 m), weight 252 lb 12.8 oz (114.7 kg).Body mass index is 38.44 kg/m.  General Appearance: Casual and Fairly Groomed  Eye Contact:  Good  Speech:  Clear and Coherent  Volume:  Normal  Mood:  Anxious but less so than last visit   Affect:  Congruent  Thought Process:  Goal Directed  Orientation:  Full (Time, Place, and Person)  Thought Content:  Obsessions and Rumination  Suicidal Thoughts:  No  Homicidal Thoughts:  No  Memory:  Immediate;   Fair Recent;   Fair Remote;   Fair  Judgement:  Good  Insight:  Fair  Psychomotor Activity:  Normal  Concentration:  Concentration: Good and Attention Span: Good  Recall:  Good  Fund of Knowledge:Good  Language: Good  Akathisia:  No  Handed:  Right  AIMS (if indicated):    Assets:  Communication Skills Desire for Improvement Physical Health Resilience Social Support  ADL's:  Intact  Cognition: WNL  Sleep:  ok    Treatment Plan Summary: Medication management  The patient continue Prozac 20 mg daily and increase Xanax to 1 mg 3 times a day. He'll return to see me in 6 weeks or call sooner if his symptoms worsen He will continue his counseling with Dr. Tommie Ard, Neoma Laming, MD 9/8/201710:43 AM

## 2016-05-31 NOTE — Telephone Encounter (Signed)
Pt was calling to see if he could get some samples of dexilant. Please call [phone]

## 2016-06-03 ENCOUNTER — Ambulatory Visit (HOSPITAL_COMMUNITY): Payer: Self-pay | Admitting: Psychiatry

## 2016-06-04 ENCOUNTER — Ambulatory Visit (INDEPENDENT_AMBULATORY_CARE_PROVIDER_SITE_OTHER): Payer: BLUE CROSS/BLUE SHIELD | Admitting: Psychology

## 2016-06-04 ENCOUNTER — Encounter (HOSPITAL_COMMUNITY): Payer: Self-pay | Admitting: Psychology

## 2016-06-04 DIAGNOSIS — F4001 Agoraphobia with panic disorder: Secondary | ICD-10-CM

## 2016-06-04 NOTE — Telephone Encounter (Signed)
noted 

## 2016-06-04 NOTE — Telephone Encounter (Signed)
Samples (#3boxes) are at the front desk. Sent pt a message via mychart.

## 2016-06-04 NOTE — Progress Notes (Signed)
Patient:  Samuel Blackwell   DOB: 1991/07/19  MR Number: [id number]  Location: [facility] [address] [address] Dept: [phone]  Start: 8 AM  End: 9 AM  Provider/Observer:     Edgardo Roys PSYD  Chief Complaint:      Chief Complaint  Patient presents with  . Anxiety  . Panic Attack    Reason For Service:   The patient reports that he was referred for psychotherapeutic and psychiatric care due to severe anxiety and ongoing panic attacks. He reports that the major stressors that he is dealing with right now have to do with work stress as well as his health concerns. The patient reports that he has been diagnosed with supraventricular tachycardia and has had heart rates measured as high as 250. He was treated to cardiology and had SVT ablation surgery conducted where 2 of the 3 pathways that he had were removed. The patient reports that he has not had any of these events since the had the surgery but he is constantly worrying about what will happen if he has a severe Tachycardia events again. The patient is very hypervigilant about this issue. The patient reports that he is currently worried about something happening cardiovascularly. He reports that he will not leave the area as he is fearful of being away from his doctors either at [facility] in the emergency department or his cardiologist. He had the ablation surgery conducted in October. The patient reports that he had been going to the gym after the surgery and started doing better but then he had an event where 2 of his friends died started worrying again. After that he had his wisdom teeth removed and began going online looking at medical issues and sought report where a young woman had a cardiovascular event and died while having her wisdom teeth removed. He reports things like this set him off.  The patient reports that he  currently experiences significant symptoms related to anxiety, mood changes, sleep disturbance, work problems, excessive worrying, panic attacks, obsessive thinking, and poor concentration. He reports that when he is having a panic attack he will have a racing heart beat and begins to have overwhelming fears that he is dying. He reports that he is constantly worried. 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 However, he would have panic attacks and begin fearful that something would happen when he was in charge and took a demotion at work so he would not have this pressure/stress. The patient reports that the effects of these issues effectively left him at home most of the time and fearful of being around crowds of people are being away from his healthcare providers.  The patient actually was seen in our office and I saw him personally when he was an adolescent. The patient acknowledges remembering these events and states that he had been a very angry young man but did not have any significant anxiety and certainly had no panic attacks at that time. The patient reports that his mother was very difficult to live with at the time his father was not around him but his father is become an important part of his life in adulthood. The patient has a significant family history for severe anxiety including his mother, father, maternal grandmother.   Interventions Strategy:  Cognitive Behavioral Therapeutic interventions  Participation Level:   Active  Participation Quality:  Appropriate      Behavioral Observation:  Well Groomed, Alert, and Appropriate.   Current Psychosocial Factors: The patient reports that he has been actively working on therapeutic interventions and working on trying to deal with stress and anxiety associated with his return to work the patient reports that is not had any panic attacks recently but that his  return to work is just a couple of days away. 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   Content of Session:   Reviewed current symptoms and situations.  Worked on treatment goals.  Current Status:   The patient reports that he has had reduction in anxiety and that he has not had any panic events recently. He has reduce some of his hypervigilance around anxiety and cardiovascular changes as well. The patient reports that no panic attacks ensued. There is an active plan to return to work very soon.  Patient Progress:   Good.  Active participation and fully engaged in process of treatment.  Target Goals:   Reduce intensity, severity, and duration of panic events and general anxiety levels.  Last Reviewed:   06/04/2016  Goals Addressed Today:    Worked on coping skills around panic attacks and agoraphobia  Impression/Diagnosis:  The patient reports significant anxiety starting and young adulthood and has progressively worsened. The patient had some panic attacks but also had significant medical issues that contributed to worsening of his condition. He was diagnosed with supraventricular tachycardia and had ablative surgery in October. He reports that he has not had any other major tachycardic events related to the electrical signals to his heart but is in constant fear that something will happen cardiovascularly and he will die. The patient has been having ongoing increases in heart rate and blood pressure during these events that are ongoing today. He has developed mild to moderate issues related to agoraphobia.   Diagnosis:    Axis I: Panic disorder with agoraphobia and moderate panic attacks        Venicia Vandall R, PsyD 06/04/2016

## 2016-06-12 ENCOUNTER — Ambulatory Visit (INDEPENDENT_AMBULATORY_CARE_PROVIDER_SITE_OTHER): Payer: BLUE CROSS/BLUE SHIELD | Admitting: Cardiovascular Disease

## 2016-06-12 ENCOUNTER — Encounter: Payer: Self-pay | Admitting: Cardiovascular Disease

## 2016-06-12 VITALS — BP 118/80 | HR 77 | Wt 251.0 lb

## 2016-06-12 DIAGNOSIS — Z9889 Other specified postprocedural states: Secondary | ICD-10-CM

## 2016-06-12 DIAGNOSIS — I471 Supraventricular tachycardia: Secondary | ICD-10-CM

## 2016-06-12 NOTE — Patient Instructions (Signed)
Medication Instructions:  Your physician recommends that you continue on your current medications as directed. Please refer to the Current Medication list given to you today.   Labwork: none  Testing/Procedures: none  Follow-Up:  Your physician recommends that you schedule a follow-up appointment in: As Needed  Any Other Special Instructions Will Be Listed Below (If Applicable).  If you need a refill on your cardiac medications before your next appointment, please call your pharmacy.  

## 2016-06-12 NOTE — Progress Notes (Signed)
SUBJECTIVE: The patient is a 25 year old male with a history of SVT status post ablation in 2016. Also has anxiety attacks and has presented to the ED this year with palps.  Now seeing a psychiatrist and is taking Prozac. He now has coping mechanisms for his anxiety attacks. He denies chest pain and palpitations. He is about to start a new job and requests an ECG and an evaluation.    Review of Systems: As per "subjective", otherwise negative.  No Known Allergies  Current Outpatient Prescriptions  Medication Sig Dispense Refill  . ALPRAZolam (XANAX) 1 MG tablet Take 1 tablet (1 mg total) by mouth 3 (three) times daily as needed for anxiety. 90 tablet 2  . DEXILANT 60 MG capsule Take 60 mg by mouth daily.    Marland Kitchen FLUoxetine (PROZAC) 20 MG capsule Take 1 capsule (20 mg total) by mouth daily. 30 capsule 2  . ibuprofen (ADVIL,MOTRIN) 800 MG tablet Take 800 mg by mouth every 8 (eight) hours as needed for mild pain or moderate pain.      No current facility-administered medications for this visit.     Past Medical History:  Diagnosis Date  . Anxiety   . GERD (gastroesophageal reflux disease)   . Hiatal hernia   . Migraines   . Sinus tachycardia ([place])    r/t anxiety  . SVT (supraventricular tachycardia) ([facility])     Past Surgical History:  Procedure Laterality Date  . ABLATION OF DYSRHYTHMIC FOCUS  06/28/2015   SVT  . ELECTROPHYSIOLOGIC STUDY N/A 06/28/2015   Procedure: SVT Ablation;  Surgeon: Evans Lance, MD;  Location: [facility];  Service: Cardiovascular;  Laterality: N/A;  . ESOPHAGOGASTRODUODENOSCOPY (EGD) WITH PROPOFOL N/A 01/22/2016   Dr. Gala Romney: negative Barrett's, small hiatal hernia  . MALONEY DILATION N/A 01/22/2016   Procedure: Venia Minks DILATION;  Surgeon: Daneil Dolin, MD;  [facility];  Service: Endoscopy;  Laterality: N/A;    Social History   Social History  . Marital status: Married    Spouse name: N/A  . Number of children: N/A  . Years  of education: N/A   Occupational History  . [employer]    Social History Main Topics  . Smoking status: Former Smoker    Packs/day: 0.25    Years: 6.00    Types: Cigarettes    Start date: 12/01/2007    Quit date: 03/23/2014  . Smokeless tobacco: Former Systems developer    Types: [place] date: 09/19/2014  . Alcohol use No     Comment: 04-30-16 per pt no but use to  . Drug use: No     Comment: 04-30-16 per pt no  . Sexual activity: Yes    Partners: Female   Other Topics Concern  . Not on file   Social History Narrative  . No narrative on file     Vitals:   06/12/16 0843  BP: 118/80  Pulse: 77  SpO2: 98%  Weight: 251 lb (113.9 kg)    PHYSICAL EXAM General: NAD HEENT: Normal. Neck: No JVD, no thyromegaly. Lungs: Clear to auscultation bilaterally with normal respiratory effort. CV: Nondisplaced PMI.  Regular rate and rhythm, normal S1/S2, no S3/S4, no murmur. No pretibial or periankle edema.     Abdomen: Soft, nontender, no distention.  Neurologic: Alert and oriented.  Psych: Normal affect. Skin: Normal. Musculoskeletal: No gross deformities.    ECG: Most recent ECG reviewed.      ASSESSMENT AND  PLAN: 1. SVT s/p ablation: Symptomatically stable without recurrence. Will obtain ECG per his request.  Dispo: fu prn.   Kate Sable, M.D., F.A.C.C.

## 2016-06-25 ENCOUNTER — Ambulatory Visit (HOSPITAL_COMMUNITY): Payer: Self-pay | Admitting: Psychology

## 2016-06-25 ENCOUNTER — Encounter (HOSPITAL_COMMUNITY): Payer: Self-pay | Admitting: Psychology

## 2016-07-12 ENCOUNTER — Encounter (HOSPITAL_COMMUNITY): Payer: Self-pay

## 2016-07-12 ENCOUNTER — Ambulatory Visit (HOSPITAL_COMMUNITY): Payer: BLUE CROSS/BLUE SHIELD | Admitting: Psychiatry

## 2016-07-15 ENCOUNTER — Telehealth (HOSPITAL_COMMUNITY): Payer: Self-pay | Admitting: *Deleted

## 2016-07-15 NOTE — Telephone Encounter (Signed)
RETURNED PHONE [place].

## 2016-08-07 ENCOUNTER — Telehealth: Payer: Self-pay | Admitting: Internal Medicine

## 2016-08-07 ENCOUNTER — Other Ambulatory Visit: Payer: Self-pay | Admitting: Gastroenterology

## 2016-08-07 MED ORDER — DEXILANT 60 MG PO CPDR: 60.0000 mg | DELAYED_RELEASE_CAPSULE | Freq: Every day | ORAL | 3 refills | Status: AC

## 2016-08-07 NOTE — Telephone Encounter (Signed)
c 

## 2016-08-07 NOTE — Telephone Encounter (Signed)
Patient called to see if he could get dexilant samples. Please call [phone]

## 2016-08-07 NOTE — Telephone Encounter (Signed)
Two boxes of Dexilant is at the front desk for the patient to pick up and referral faxed to the Health Department for assistance with getting his Dexilant

## 2016-08-26 ENCOUNTER — Telehealth (HOSPITAL_COMMUNITY): Payer: Self-pay | Admitting: *Deleted

## 2016-08-26 NOTE — Telephone Encounter (Signed)
VOICE MESSAGE FROM PATIENT TO CANCEL APPOINTMENT FOR 08/28/16.  SAID HE WILL BE OUT OF TOWN.

## 2016-08-28 ENCOUNTER — Encounter (HOSPITAL_COMMUNITY): Payer: Self-pay

## 2016-08-28 ENCOUNTER — Ambulatory Visit (HOSPITAL_COMMUNITY): Payer: Self-pay | Admitting: Psychiatry

## 2016-09-22 ENCOUNTER — Emergency Department (HOSPITAL_COMMUNITY)
Admission: EM | Admit: 2016-09-22 | Discharge: 2016-09-22 | Disposition: A | Discharge status: Home / Self Care | Payer: BLUE CROSS/BLUE SHIELD | Attending: Emergency Medicine | Admitting: Emergency Medicine

## 2016-09-22 ENCOUNTER — Encounter (HOSPITAL_COMMUNITY): Payer: Self-pay | Admitting: Emergency Medicine

## 2016-09-22 ENCOUNTER — Emergency Department (HOSPITAL_COMMUNITY): Payer: BLUE CROSS/BLUE SHIELD

## 2016-09-22 DIAGNOSIS — R0789 Other chest pain: Secondary | ICD-10-CM

## 2016-09-22 DIAGNOSIS — Z791 Long term (current) use of non-steroidal anti-inflammatories (NSAID): Secondary | ICD-10-CM | POA: Insufficient documentation

## 2016-09-22 DIAGNOSIS — Z79899 Other long term (current) drug therapy: Secondary | ICD-10-CM | POA: Insufficient documentation

## 2016-09-22 DIAGNOSIS — Z87891 Personal history of nicotine dependence: Secondary | ICD-10-CM | POA: Insufficient documentation

## 2016-09-22 LAB — BASIC METABOLIC PANEL
Anion gap: 7 (ref 5–15)
BUN: 9 mg/dL (ref 6–20)
CHLORIDE: 103 mmol/L (ref 101–111)
CO2: 28 mmol/L (ref 22–32)
CREATININE: 0.84 mg/dL (ref 0.61–1.24)
Calcium: 9.3 mg/dL (ref 8.9–10.3)
GFR calc Af Amer: >60 mL/min (ref >60)
GFR calc non Af Amer: >60 mL/min (ref >60)
GLUCOSE: 98 mg/dL (ref 65–99)
Potassium: 4.2 mmol/L (ref 3.5–5.1)
Sodium: 138 mmol/L (ref 135–145)

## 2016-09-22 LAB — HEPATIC FUNCTION PANEL
ALT: 60 U/L (ref 17–63)
AST: 27 U/L (ref 15–41)
Albumin: 4.5 g/dL (ref 3.5–5.0)
Alkaline Phosphatase: 87 U/L (ref 38–126)
TOTAL PROTEIN: 7.9 g/dL (ref 6.5–8.1)
Total Bilirubin: 1.1 mg/dL (ref 0.3–1.2)

## 2016-09-22 LAB — CBC
HEMATOCRIT: 49 % (ref 39.0–52.0)
Hemoglobin: 16.2 g/dL (ref 13.0–17.0)
MCH: 28.2 pg (ref 26.0–34.0)
MCHC: 33.1 g/dL (ref 30.0–36.0)
MCV: 85.4 fL (ref 78.0–100.0)
PLATELETS: 272 10*3/uL (ref 150–400)
RBC: 5.74 MIL/uL (ref 4.22–5.81)
RDW: 12.8 % (ref 11.5–15.5)
WBC: 9.7 10*3/uL (ref 4.0–10.5)

## 2016-09-22 LAB — TROPONIN I: Troponin I: <0.03 ng/mL (ref <0.03)

## 2016-09-22 LAB — LIPASE, BLOOD: LIPASE: 27 U/L (ref 11–51)

## 2016-09-22 MED ORDER — IBUPROFEN 800 MG PO TABS
800.0000 mg | ORAL_TABLET | Freq: Once | ORAL | Status: AC
  Administered 2016-09-22: 800 mg via ORAL
  Filled 2016-09-22: qty 1

## 2016-09-22 NOTE — ED Notes (Signed)
Visitor walked to desk and requested ibuprofen for pt. Dr zammit notiifed and gave verbal order

## 2016-09-22 NOTE — ED Triage Notes (Signed)
Pt was in church this morning and began having pain in center of chest with radiation to neck.  Pt also sob and has had productive cough for 1 week. Pt alert and oriented in triage.

## 2016-09-22 NOTE — Discharge Instructions (Signed)
Follow up with your md in 1 week for recheck °

## 2016-09-22 NOTE — ED Provider Notes (Signed)
Samuel Blackwell   CSN: [id number] Arrival date & time: 09/22/16  1151  By signing my name below, I, Samuel Blackwell, attest that this documentation has been prepared under the direction and in the presence of Samuel Ferguson, MD. Electronically Signed: Sonum Blackwell, Education administrator. 09/22/16. 1:09 PM.  History   Chief Complaint Chief Complaint  Patient presents with  . Chest Pain    The history is provided by the patient. No language interpreter was used.  Chest Pain   This is a new problem. The current episode started less than 1 hour ago. The problem has been resolved. The pain is associated with breathing. The pain is present in the substernal region. The quality of the pain is described as stabbing. The pain does not radiate. Duration of episode(s) is 10 minutes. The symptoms are aggravated by deep breathing. Pertinent negatives include no shortness of breath.     HPI Comments: Samuel Blackwell is a 25 y.o. male who presents to the Emergency Department complaining of substernal chest pain that occurred PTA and lasted for about 10 minutes. He describes the pain as stabbing in nature and is worse with deep breathing. He has a history of SVT and subsequent ablation in 2016. He reports a recent sinus infection that is gradually improving. He denies any other symptoms at this time.   Past Medical History:  Diagnosis Date  . Anxiety   . GERD (gastroesophageal reflux disease)   . Hiatal hernia   . Migraines   . Sinus tachycardia    r/t anxiety  . SVT (supraventricular tachycardia) [facility])     Patient Active Problem List   Diagnosis Date Noted  . Dysphagia   . Mucosal abnormality of esophagus   . Hiatal hernia   . Abdominal pain 12/27/2015  . Globus sensation 12/27/2015  . Chest pain 06/14/2015  . GERD (gastroesophageal reflux disease) 06/14/2015  . Anxiety 05/24/2015  . SVT (supraventricular tachycardia) ([place]) 09/29/2014    Past Surgical History:  Procedure Laterality Date    . ABLATION OF DYSRHYTHMIC FOCUS  06/28/2015   SVT  . ELECTROPHYSIOLOGIC STUDY N/A 06/28/2015   Procedure: SVT Ablation;  Surgeon: Evans Lance, MD;  Location: [place] [facility];  Service: Cardiovascular;  Laterality: N/A;  . ESOPHAGOGASTRODUODENOSCOPY (EGD) WITH PROPOFOL N/A 01/22/2016   Dr. Gala Romney: negative Barrett's, small hiatal hernia  . MALONEY DILATION N/A 01/22/2016   Procedure: Venia Minks DILATION;  Surgeon: Daneil Dolin, MD;  [facility];  Service: Endoscopy;  Laterality: N/A;       Home Medications    Prior to Admission medications   Medication Sig Start Date End Date Taking? Authorizing Provider  ALPRAZolam [name]) 1 MG tablet Take 1 tablet (1 mg total) by mouth 3 (three) times daily as needed for anxiety. 05/31/16   Cloria Spring, MD  DEXILANT 60 MG capsule Take 1 capsule (60 mg total) by mouth daily. 08/07/16   Annitta Needs, NP  FLUoxetine (PROZAC) 20 MG capsule Take 1 capsule (20 mg total) by mouth daily. 05/31/16   Cloria Spring, MD  ibuprofen (ADVIL,MOTRIN) 800 MG tablet Take 800 mg by mouth every 8 (eight) hours as needed for mild pain or moderate pain.     Historical Provider, MD    Family History Family History  Problem Relation Age of Onset  . COPD Mother   . Depression Mother   . Anxiety disorder Mother   . Hypertension Father   . Depression Father   .  Anxiety disorder Father   . Lung cancer Maternal Grandfather   . Heart attack Paternal Grandmother   . Heart attack Paternal Grandfather   . Autism Brother   . Autism Brother   . Diabetes Other   . Cancer Other   . Heart failure Other   . Gallbladder disease      multiple family members  . Anxiety disorder Cousin   . Drug abuse Cousin   . Colon cancer Neg Hx     Social History Social History  Substance Use Topics  . Smoking status: Former Smoker    Packs/day: 0.25    Years: 6.00    Types: Cigarettes    Start date: 12/01/2007    Quit date: 03/23/2014  . Smokeless tobacco: Former Systems developer     Types: [place] date: 09/19/2014  . Alcohol use No     Comment: 04-30-16 per pt no but use to     Allergies   Patient has no known allergies.   Review of Systems Review of Systems  Respiratory: Negative for shortness of breath.   Cardiovascular: Positive for chest pain.  All other systems reviewed and are negative.    Physical Exam Updated Vital Signs BP 141/84 (BP Location: Left Arm)   Pulse 90   Temp 97.6 F (36.4 C) (Oral)   Resp 18   Ht 5\' 10"  (1.778 m)   Wt 250 lb (113.4 kg)   SpO2 100%   BMI 35.87 kg/m   Physical Exam  Constitutional: He is oriented to person, place, and time. He appears well-developed.  HENT:  Head: Normocephalic.  Eyes: Conjunctivae and EOM are normal. No scleral icterus.  Neck: Neck supple. No thyromegaly present.  Cardiovascular: Normal rate, regular rhythm and normal heart sounds.  Exam reveals no gallop and no friction rub.   No murmur heard. Pulmonary/Chest: Effort normal and breath sounds normal. No stridor. He has no wheezes. He has no rales. He exhibits no tenderness.  Abdominal: He exhibits no distension. There is no tenderness. There is no rebound.  Musculoskeletal: Normal range of motion. He exhibits no edema.  Lymphadenopathy:    He has no cervical adenopathy.  Neurological: He is oriented to person, place, and time. He exhibits normal muscle tone. Coordination normal.  Skin: No rash noted. No erythema.  Psychiatric: He has a normal mood and affect. His behavior is normal.  Nursing Blackwell and vitals reviewed.    ED Treatments / Results  DIAGNOSTIC STUDIES: Oxygen Saturation is 100% on RA, normal by my interpretation.    COORDINATION OF CARE: 1:09 PM Discussed treatment plan with pt at bedside and pt agreed to plan.   Labs (all labs ordered are listed, but only abnormal results are displayed) Labs Reviewed  BASIC METABOLIC PANEL  CBC  TROPONIN I    EKG  EKG Interpretation  Date/Time:  Sunday September 22 2016  11:52:25 EST Ventricular Rate:  86 PR Interval:  142 QRS Duration: 88 QT Interval:  338 QTC Calculation: 404 R Axis:   -9 Text Interpretation:  Normal sinus rhythm Normal ECG Confirmed by Freedom Peddy  MD, Nycole Kawahara [phone]) on 09/22/2016 1:04:54 PM       Radiology Dg Chest 2 View  Result Date: 09/22/2016 CLINICAL DATA:  Chest pain this morning EXAM: CHEST  2 VIEW COMPARISON:  03/17/2016 FINDINGS: Heart and mediastinal contours are within normal limits. No focal opacities or effusions. No acute bony abnormality. IMPRESSION: No active cardiopulmonary disease. Electronically Signed   By: Lennette Bihari  [place] M.D.   On: 09/22/2016 12:46    Procedures Procedures (including critical care time)  Medications Ordered in ED Medications - No data to display   Initial Impression / Assessment and Plan / ED Course  I have reviewed the triage vital signs and the nursing notes.  Pertinent labs & imaging results that were available during my care of the patient were reviewed by me and considered in my medical decision making (see chart for details).  Clinical Course    Patient with chest pain that has resolved. Doubt cardiac problem. Patient has history of GERD and panic attack. His symptoms have improved. Suspect the pain was related to either his GERD or panic attack. We will not make any changes medicines right now he will follow-up with his PCP   Final Clinical Impressions(s) / ED Diagnoses   Final diagnoses:  None    New Prescriptions New Prescriptions   No medications on file   The chart was scribed for me under my direct supervision.  I personally performed the history, physical, and medical decision making and all procedures in the evaluation of this patient.Samuel Ferguson, MD 09/22/16 [phone]

## 2016-12-21 IMAGING — US US ABDOMEN LIMITED
1 series · 14 of 25 positions shown · non-contrast
Comparison: CT abdomen 11/27/2013

CLINICAL DATA: Abdominal pain right upper quadrant

EXAM:
US ABDOMEN LIMITED - RIGHT UPPER QUADRANT

[Series 1: us abdomen limited · 0.21mm/px · 14 of 46 slices shown]
[im 1/46]
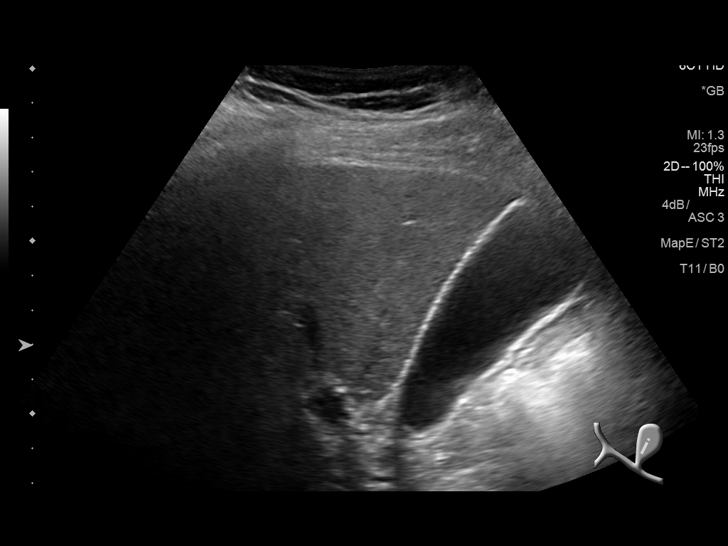
[im 4/46]
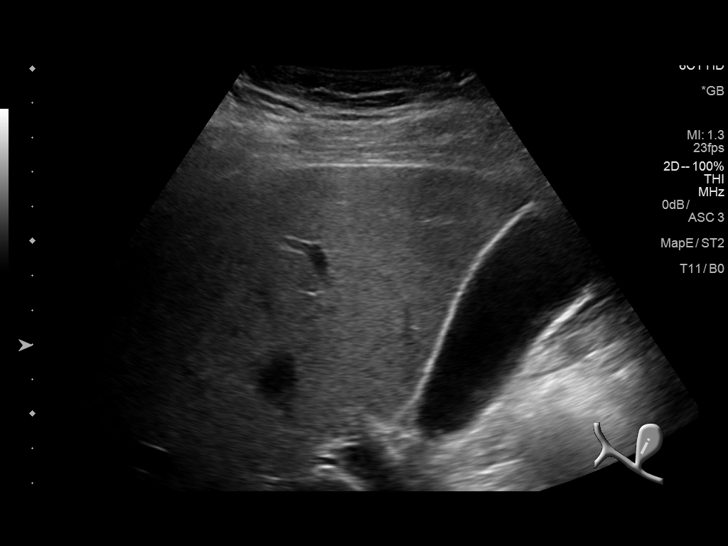
[im 8/46]
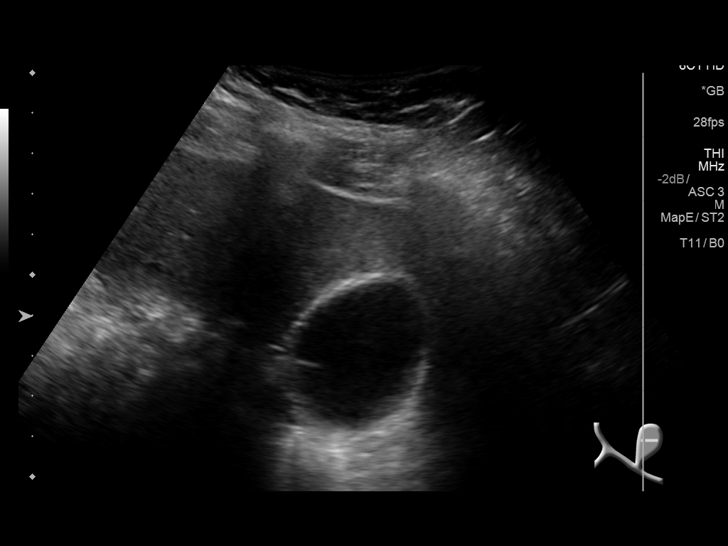
[im 12/46]
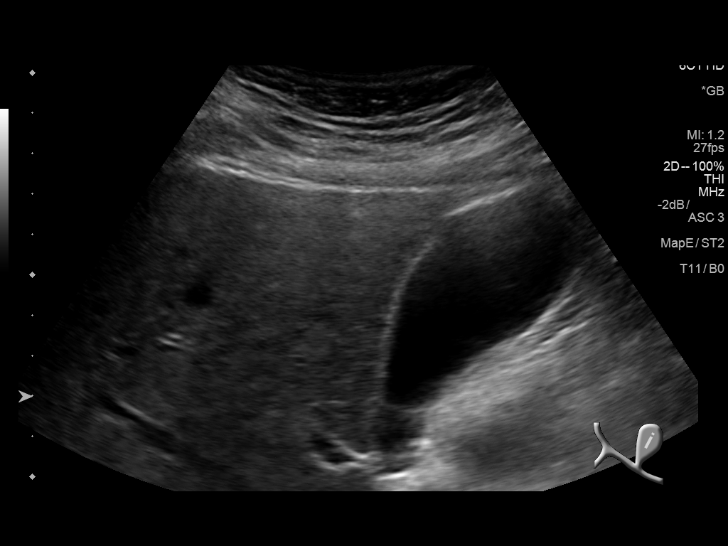
[im 16/46]
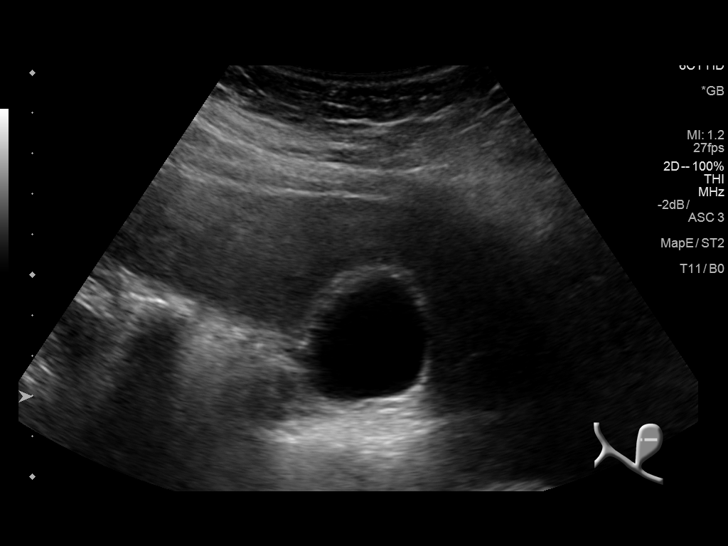
[im 17/46]
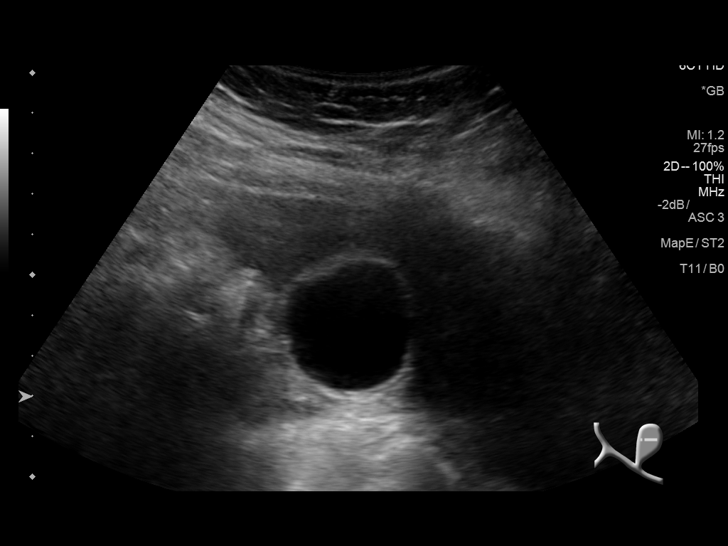
[im 21/46]
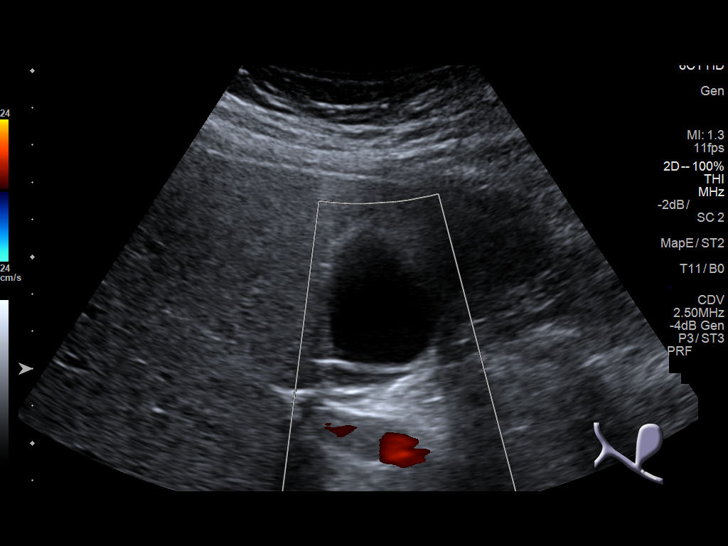
[im 25/46]
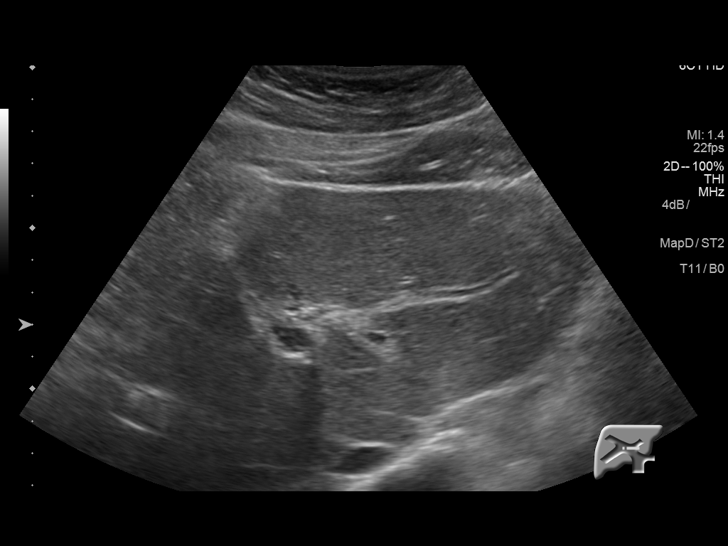
[im 29/46]
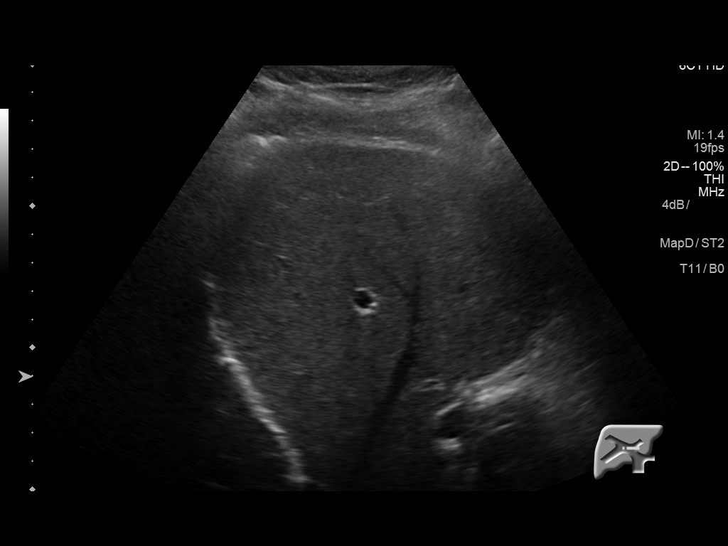
[im 31/46]
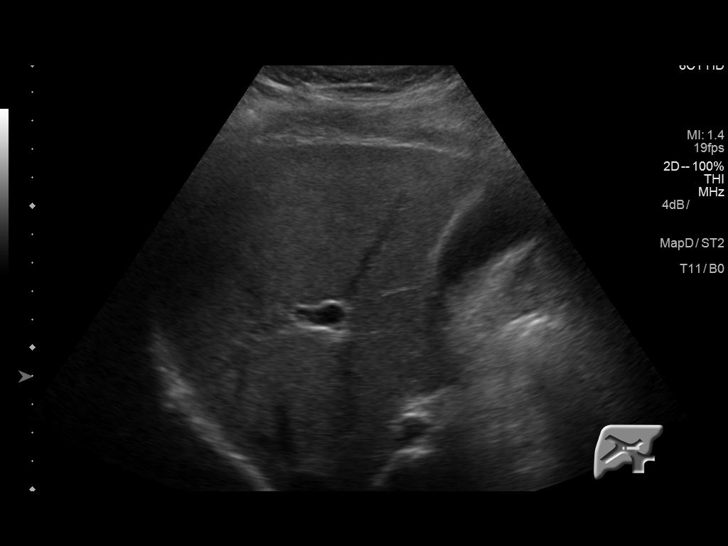
[im 34/46]
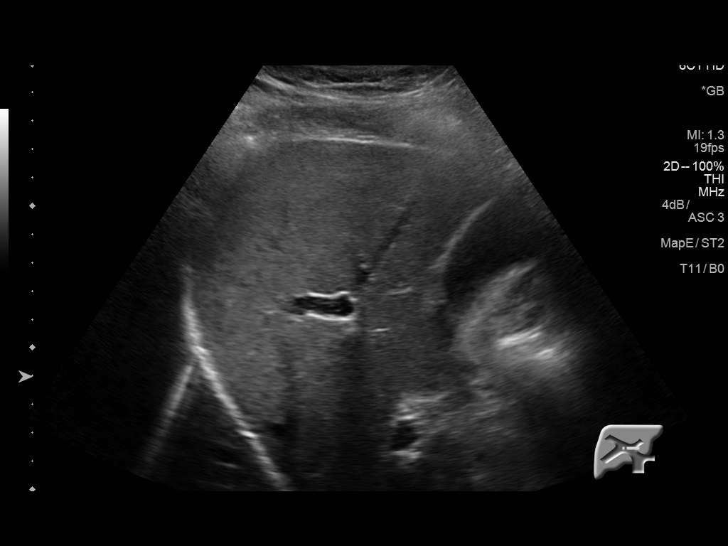
[im 38/46]
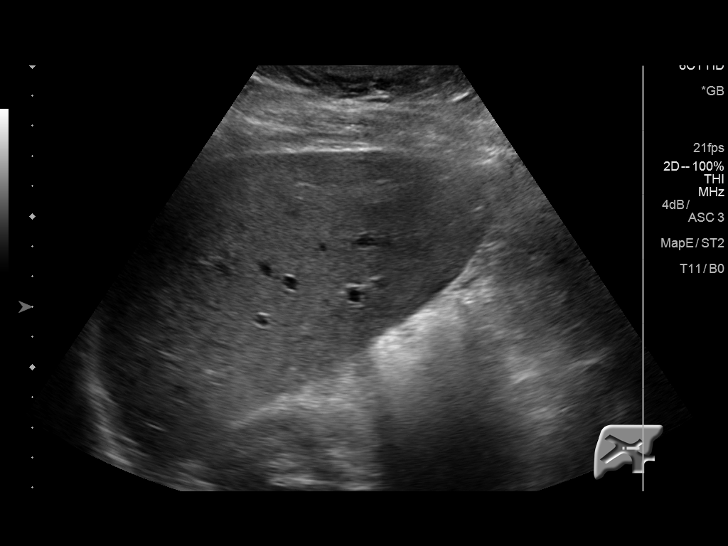
[im 42/46]
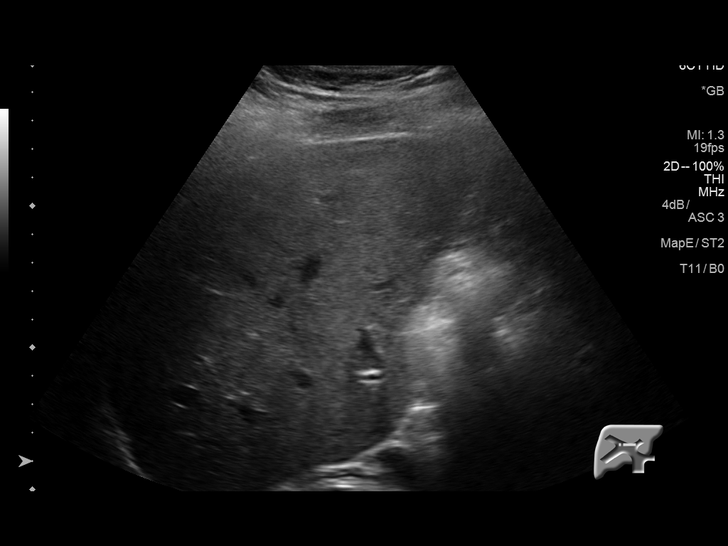
[im 46/46]
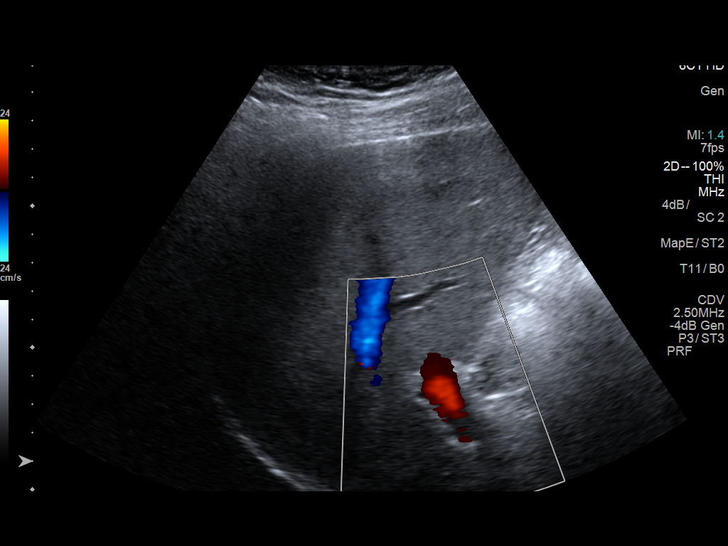

[14 of 25 positions shown; findings below may reference images not displayed]

FINDINGS: Gallbladder:

No gallstones or wall thickening visualized. No sonographic Murphy
sign noted by sonographer.

Common bile duct:

Diameter: 3.5 mm

Liver:

No focal lesion identified. Within normal limits in parenchymal
echogenicity.
IMPRESSION: Negative

## 2016-12-30 ENCOUNTER — Emergency Department (HOSPITAL_COMMUNITY)
Admission: EM | Admit: 2016-12-30 | Discharge: 2016-12-30 | Disposition: A | Discharge status: Home / Self Care | Payer: Self-pay | Attending: Emergency Medicine | Admitting: Emergency Medicine

## 2016-12-30 ENCOUNTER — Encounter (HOSPITAL_COMMUNITY): Payer: Self-pay | Admitting: Emergency Medicine

## 2016-12-30 ENCOUNTER — Encounter (HOSPITAL_COMMUNITY): Payer: Self-pay | Admitting: *Deleted

## 2016-12-30 ENCOUNTER — Emergency Department (HOSPITAL_COMMUNITY): Payer: Self-pay

## 2016-12-30 DIAGNOSIS — R000 Tachycardia, unspecified: Secondary | ICD-10-CM | POA: Insufficient documentation

## 2016-12-30 DIAGNOSIS — Z87891 Personal history of nicotine dependence: Secondary | ICD-10-CM | POA: Insufficient documentation

## 2016-12-30 DIAGNOSIS — Z79899 Other long term (current) drug therapy: Secondary | ICD-10-CM | POA: Insufficient documentation

## 2016-12-30 DIAGNOSIS — T7840XA Allergy, unspecified, initial encounter: Secondary | ICD-10-CM | POA: Insufficient documentation

## 2016-12-30 LAB — D-DIMER, QUANTITATIVE: D-Dimer, Quant: <0.27 ug/mL-FEU (ref 0.00–0.50)

## 2016-12-30 LAB — BASIC METABOLIC PANEL
Anion gap: 11 (ref 5–15)
BUN: 9 mg/dL (ref 6–20)
CO2: 23 mmol/L (ref 22–32)
CREATININE: 0.98 mg/dL (ref 0.61–1.24)
Calcium: 9.8 mg/dL (ref 8.9–10.3)
Chloride: 105 mmol/L (ref 101–111)
GFR calc Af Amer: >60 mL/min (ref >60)
GLUCOSE: 178 mg/dL — AB (ref 65–99)
Potassium: 4 mmol/L (ref 3.5–5.1)
Sodium: 139 mmol/L (ref 135–145)

## 2016-12-30 LAB — CBC
HCT: 49 % (ref 39.0–52.0)
HEMOGLOBIN: 17.4 g/dL — AB (ref 13.0–17.0)
MCH: 29.4 pg (ref 26.0–34.0)
MCHC: 35.5 g/dL (ref 30.0–36.0)
MCV: 82.9 fL (ref 78.0–100.0)
PLATELETS: 328 10*3/uL (ref 150–400)
RBC: 5.91 MIL/uL — ABNORMAL HIGH (ref 4.22–5.81)
RDW: 12.4 % (ref 11.5–15.5)
WBC: 10.4 10*3/uL (ref 4.0–10.5)

## 2016-12-30 MED ORDER — DIPHENHYDRAMINE HCL 25 MG PO CAPS
50.0000 mg | ORAL_CAPSULE | Freq: Once | ORAL | Status: AC
  Administered 2016-12-30: 50 mg via ORAL
  Filled 2016-12-30: qty 2

## 2016-12-30 MED ORDER — METOPROLOL TARTRATE 50 MG PO TABS: 50.0000 mg | ORAL_TABLET | Freq: Two times a day (BID) | ORAL | 0 refills | Status: AC

## 2016-12-30 MED ORDER — DIPHENHYDRAMINE HCL 50 MG/ML IJ SOLN: 25.0000 mg | Freq: Once | INTRAMUSCULAR | Status: DC

## 2016-12-30 MED ORDER — METOPROLOL TARTRATE 25 MG PO TABS
25.0000 mg | ORAL_TABLET | Freq: Once | ORAL | Status: AC
  Administered 2016-12-30: 25 mg via ORAL
  Filled 2016-12-30: qty 1

## 2016-12-30 MED ORDER — SODIUM CHLORIDE 0.9 % IV BOLUS (SEPSIS)
1000.0000 mL | Freq: Once | INTRAVENOUS | Status: AC
  Administered 2016-12-30: 1000 mL via INTRAVENOUS

## 2016-12-30 MED ORDER — METOPROLOL TARTRATE 5 MG/5ML IV SOLN
5.0000 mg | INTRAVENOUS | Status: AC
  Administered 2016-12-30: 5 mg via INTRAVENOUS
  Filled 2016-12-30: qty 5

## 2016-12-30 MED ORDER — FAMOTIDINE 20 MG PO TABS
40.0000 mg | ORAL_TABLET | Freq: Once | ORAL | Status: AC
  Administered 2016-12-30: 40 mg via ORAL
  Filled 2016-12-30: qty 2

## 2016-12-30 MED ORDER — DEXAMETHASONE SODIUM PHOSPHATE 4 MG/ML IJ SOLN
10.0000 mg | Freq: Once | INTRAMUSCULAR | Status: AC
  Administered 2016-12-30: 10 mg via INTRAMUSCULAR
  Filled 2016-12-30: qty 3

## 2016-12-30 MED ORDER — DEXAMETHASONE SODIUM PHOSPHATE 4 MG/ML IJ SOLN: 10.0000 mg | Freq: Once | INTRAMUSCULAR | Status: DC

## 2016-12-30 MED ORDER — ACETAMINOPHEN 325 MG PO TABS
650.0000 mg | ORAL_TABLET | Freq: Once | ORAL | Status: AC
  Administered 2016-12-30: 650 mg via ORAL
  Filled 2016-12-30: qty 2

## 2016-12-30 NOTE — ED Triage Notes (Signed)
Pt c/o increased heart rate and bilateral forearms and calves feeling numb. Pt reports he was in ED today and being treated for allergic reaction to Amoxicillin. Denies SOB.

## 2016-12-30 NOTE — ED Provider Notes (Signed)
[facility] DEPT Provider Note   CSN: [id number] Arrival date & time: 12/30/16  0615     History   Chief Complaint Chief Complaint  Patient presents with  . Shortness of Breath    HPI Samuel Blackwell is a 26 y.o. male.  Pt with a PMH of SVT s/p ablation (2016), GERD, Anxiety presents with several hour h/o "throat tightness" and difficulty breathing. Notes that he awoke with difficulty breathing this AM and decided to come in to the ED on his way to work for concern of his throat feeling tight. He denies similar sensations in the past. Denies actual wheezes or SOB. Reports that he went to his PCP 3 days ago with complaint of HAs, mild dizziness, and sinus pressure which he thought were related to seasonal allergies, and he was given a prescription for Amoxicillin which he started that day. He does not think that he has ever taken Amoxicillin in the past. Denies any h/o allergies or allergic reaction to foods/medications. No new exposures recently other than Abx. His original symptoms of HA/sinus pressure have mostly resolved on presentation today. Pt denies any rashes, swelling of face, lips, tongue or palpitations. He is not in respiratory distress. No fevers/chills. He does not have cough, except when trying to clear his throat. No change in voice. He does endorse some mild difficulty with dry swallowing, but does not notice this with liquids or solids this AM.  Pt reports some h/o GERD, but denies any burning sensation and describes the tightness in his throat as different from glubus sensation he has had previously. Also denies symptoms of panic attacks which he has had in the past.      Past Medical History:  Diagnosis Date  . Anxiety   . GERD (gastroesophageal reflux disease)   . Hiatal hernia   . Migraines   . Sinus tachycardia    r/t anxiety  . SVT (supraventricular tachycardia) [facility])     Patient Active Problem List   Diagnosis Date Noted  . Dysphagia   . Mucosal  abnormality of esophagus   . Hiatal hernia   . Abdominal pain 12/27/2015  . Globus sensation 12/27/2015  . Chest pain 06/14/2015  . GERD (gastroesophageal reflux disease) 06/14/2015  . Anxiety 05/24/2015  . SVT (supraventricular tachycardia) ([place]) 09/29/2014    Past Surgical History:  Procedure Laterality Date  . ABLATION OF DYSRHYTHMIC FOCUS  06/28/2015   SVT  . ELECTROPHYSIOLOGIC STUDY N/A 06/28/2015   Procedure: SVT Ablation;  Surgeon: Evans Lance, MD;  Location: [facility];  Service: Cardiovascular;  Laterality: N/A;  . ESOPHAGOGASTRODUODENOSCOPY (EGD) WITH PROPOFOL N/A 01/22/2016   Dr. Gala Romney: negative Barrett's, small hiatal hernia  . MALONEY DILATION N/A 01/22/2016   Procedure: Venia Minks DILATION;  Surgeon: Daneil Dolin, MD;  Location: [facility];  Service: Endoscopy;  Laterality: N/A;       Home Medications    Prior to Admission medications   Medication Sig Start Date End Date Taking? Authorizing Provider  ALPRAZolam [name]) 1 MG tablet Take 1 tablet (1 mg total) by mouth 3 (three) times daily as needed for anxiety. 05/31/16  Yes Cloria Spring, MD  cetirizine (ZYRTEC) 10 MG tablet Take 10 mg by mouth daily.   Yes Historical Provider, MD  DEXILANT 60 MG capsule Take 1 capsule (60 mg total) by mouth daily. 08/07/16  Yes Annitta Needs, NP  FLUoxetine (PROZAC) 20 MG capsule Take 1 capsule (20 mg total) by mouth daily. 05/31/16  Yes Cloria Spring, MD  ibuprofen (ADVIL,MOTRIN) 800 MG tablet Take 800 mg by mouth every 8 (eight) hours as needed for mild pain or moderate pain.    Yes Historical Provider, MD  tiZANidine (ZANAFLEX) 2 MG tablet Take 2 mg by mouth every 6 (six) hours as needed for muscle spasms.   Yes Historical Provider, MD    Family History Family History  Problem Relation Age of Onset  . COPD Mother   . Depression Mother   . Anxiety disorder Mother   . Hypertension Father   . Depression Father   . Anxiety disorder Father   . Lung cancer Maternal  Grandfather   . Heart attack Paternal Grandmother   . Heart attack Paternal Grandfather   . Autism Brother   . Autism Brother   . Diabetes Other   . Cancer Other   . Heart failure Other   . Gallbladder disease      multiple family members  . Anxiety disorder Cousin   . Drug abuse Cousin   . Colon cancer Neg Hx     Social History Social History  Substance Use Topics  . Smoking status: Former Smoker    Packs/day: 0.25    Years: 6.00    Types: Cigarettes    Start date: 12/01/2007    Quit date: 03/23/2014  . Smokeless tobacco: Former Systems developer    Types: [place] date: 09/19/2014  . Alcohol use No     Comment: 04-30-16 per pt no but use to     Allergies   Amoxicillin   Review of Systems Review of Systems  Constitutional: Negative for chills and fever.  HENT: Positive for trouble swallowing (mild). Negative for congestion, ear pain, facial swelling, rhinorrhea, sinus pain, sinus pressure, sore throat, tinnitus and voice change.   Eyes: Negative for pain, redness, itching and visual disturbance.  Respiratory: Negative for cough and shortness of breath.   Cardiovascular: Negative for chest pain and palpitations.  Gastrointestinal: Negative for abdominal pain and vomiting.  Genitourinary: Negative for dysuria and hematuria.  Musculoskeletal: Negative for arthralgias and back pain.  Skin: Negative for color change and rash.  Allergic/Immunologic: Negative for environmental allergies and food allergies.  Neurological: Negative for dizziness, syncope, light-headedness and headaches.  All other systems reviewed and are negative.    Physical Exam Updated Vital Signs BP (!) 142/85 (BP Location: Left Arm)   Pulse (!) 103   Temp 98.2 F (36.8 C) (Oral)   Resp 19   SpO2 99%   Physical Exam  Constitutional: He is oriented to person, place, and time. He appears well-developed and well-nourished. No distress.  HENT:  Head: Normocephalic and atraumatic.  Nose: No mucosal edema,  rhinorrhea or nasal deformity. Right sinus exhibits no maxillary sinus tenderness and no frontal sinus tenderness. Left sinus exhibits no maxillary sinus tenderness and no frontal sinus tenderness.  Mouth/Throat: Mucous membranes are not pale and not dry. Posterior oropharyngeal erythema (mild) present. No oropharyngeal exudate or posterior oropharyngeal edema.  Eyes: Conjunctivae are normal. Pupils are equal, round, and reactive to light.  Neck: Neck supple.  Cardiovascular: Normal rate and regular rhythm.   No murmur heard. Pulmonary/Chest: Effort normal and breath sounds normal. No respiratory distress. He has no wheezes. He has no rales.  Abdominal: Soft. There is no tenderness.  Musculoskeletal: He exhibits no edema.  Lymphadenopathy:       Head (right side): No submental, no submandibular and no tonsillar adenopathy present.  Head (left side): No submental, no submandibular and no tonsillar adenopathy present.    He has no cervical adenopathy.  Neurological: He is alert and oriented to person, place, and time.  Skin: Skin is warm and dry. No rash noted.  Psychiatric: He has a normal mood and affect.  Nursing note and vitals reviewed.   ED Treatments / Results  Labs (all labs ordered are listed, but only abnormal results are displayed) Labs Reviewed - No data to display  EKG  EKG Interpretation None       Radiology Dg Chest 2 View  Result Date: 12/30/2016 CLINICAL DATA:  Acute onset of difficulty breathing. Sore throat and upper chest pain. Initial encounter. EXAM: CHEST  2 VIEW COMPARISON:  Chest radiograph performed 09/22/2016 FINDINGS: The lungs are well-aerated and clear. There is no evidence of focal opacification, pleural effusion or pneumothorax. The heart is normal in size; the mediastinal contour is within normal limits. No acute osseous abnormalities are seen. IMPRESSION: No acute cardiopulmonary process seen. Electronically Signed   By: Garald Balding M.D.   On:  12/30/2016 06:51    Procedures Procedures (including critical care time)  Medications Ordered in ED Medications  famotidine (PEPCID) tablet 40 mg (40 mg Oral Given 12/30/16 0803)  dexamethasone (DECADRON) injection 10 mg (10 mg Intramuscular Given 12/30/16 0803)  diphenhydrAMINE (BENADRYL) capsule 50 mg (50 mg Oral Given 12/30/16 0803)     Initial Impression / Assessment and Plan / ED Course  I have reviewed the triage vital signs and the nursing notes.  Pertinent labs & imaging results that were available during my care of the patient were reviewed by me and considered in my medical decision making (see chart for details).  Clinical Course as of Dec 31 927  Mon Dec 30, 2016  0750 Pt seen and evaluated. In NAD. C/o "tightness" in throat w/ recent Amoxicillin therapy. No rashes, angioedema. HDS. Concern for Hypersensitivity reaction. Will treat with antihistamines + steroids.  [BS]  0752 No acute pulmonary process. Lungs CTAB. DG Chest 2 View [BS]  [phone] Initially tachy, but resolved by time of exam. Pulse Rate: (!) 103 [BS]  0809 Treat with Decadron 10mg  IM, diphenhydramine 50mg  PO, famotidine 40mg  PO.  [BS]  0928 Symptoms improved.  [BS]    Clinical Course User Index [BS] Holley Raring, MD    Pt presents with c/o throat tightness and recent new exposure to Amoxicillin concerning for hypersensitivity reaction. Treated with steroids/antihistamines and observed. Recommend that pt discontinue Amoxicillin use (added to Allergy List), and do not feel there is currently an indication to continue antibiotic therapy. Pt is stable for discharge.  Final Clinical Impressions(s) / ED Diagnoses   Final diagnoses:  Hypersensitivity reaction, initial encounter    New Prescriptions Current Discharge Medication List       Holley Raring, MD 12/30/16 4827    Elnora Morrison, MD 12/31/16 [phone]

## 2016-12-30 NOTE — Discharge Instructions (Signed)
Take metoprolol to help with your tachycardia, follow-up with your primary doctor or cardiologist later this week or early next week.  Return for worsening symptoms

## 2016-12-30 NOTE — ED Notes (Signed)
Pt made aware to return if symptoms worsen or if any life threatening symptoms occur.  Pt verbalized understanding that he is not to drive for next 6 hours due to medication (benedryl).  Pt cousin is going to drive him home.

## 2016-12-30 NOTE — ED Triage Notes (Signed)
Pt states that it feels as though he cannot catch his breath for the last 2 days.  C/o sore throat, denies cough or congestion

## 2016-12-30 NOTE — ED Notes (Signed)
Lopressor not given due to bp. edp aware

## 2016-12-30 NOTE — ED Provider Notes (Signed)
[facility] DEPT Provider Note   CSN: [id number] Arrival date & time: 12/30/16  1643     History   Chief Complaint Chief Complaint  Patient presents with  . Tachycardia    HPI Samuel Blackwell is a 26 y.o. male.  HPI Patient was seen in the emergency room earlier today for difficulties with tightness in his throat, sore throat and difficulty breathing. This all started after taking a dose of amoxicillin. Patient was seen in the emergency room. His symptoms were felt to be related to an allergic reaction. He was treated with steroids and antihistamines. Patient went home and then he noticed a couple of hours ago that his heart was racing. Patient does have a history of SVT, anxiety as well as sinus tachycardia. He tried some his breathing exercises but his tachycardia persisted so he came into the emergency room. Denies any chest pain or shortness of breath. Does have a sensation of generalized heaviness in his extremities. He denies any focal numbness or weakness. Past Medical History:  Diagnosis Date  . Anxiety   . GERD (gastroesophageal reflux disease)   . Hiatal hernia   . Migraines   . Sinus tachycardia    r/t anxiety  . SVT (supraventricular tachycardia) [facility])     Patient Active Problem List   Diagnosis Date Noted  . Dysphagia   . Mucosal abnormality of esophagus   . Hiatal hernia   . Abdominal pain 12/27/2015  . Globus sensation 12/27/2015  . Chest pain 06/14/2015  . GERD (gastroesophageal reflux disease) 06/14/2015  . Anxiety 05/24/2015  . SVT (supraventricular tachycardia) ([place]) 09/29/2014    Past Surgical History:  Procedure Laterality Date  . ABLATION OF DYSRHYTHMIC FOCUS  06/28/2015   SVT  . ELECTROPHYSIOLOGIC STUDY N/A 06/28/2015   Procedure: SVT Ablation;  Surgeon: Evans Lance, MD;  Location: [facility] [facility];  Service: Cardiovascular;  Laterality: N/A;  . ESOPHAGOGASTRODUODENOSCOPY (EGD) WITH PROPOFOL N/A 01/22/2016   Dr. Gala Romney: negative Barrett's,  small hiatal hernia  . MALONEY DILATION N/A 01/22/2016   Procedure: Venia Minks DILATION;  Surgeon: Daneil Dolin, MD;  [facility];  Service: Endoscopy;  Laterality: N/A;       Home Medications    Prior to Admission medications   Medication Sig Start Date End Date Taking? Authorizing Provider  ALPRAZolam [name]) 1 MG tablet Take 1 tablet (1 mg total) by mouth 3 (three) times daily as needed for anxiety. 05/31/16  Yes Cloria Spring, MD  cetirizine (ZYRTEC) 10 MG tablet Take 10 mg by mouth daily.   Yes Historical Provider, MD  DEXILANT 60 MG capsule Take 1 capsule (60 mg total) by mouth daily. 08/07/16  Yes Annitta Needs, NP  FLUoxetine (PROZAC) 20 MG capsule Take 1 capsule (20 mg total) by mouth daily. 05/31/16  Yes Cloria Spring, MD  ibuprofen (ADVIL,MOTRIN) 800 MG tablet Take 800 mg by mouth every 8 (eight) hours as needed for mild pain or moderate pain.    Yes Historical Provider, MD  tiZANidine (ZANAFLEX) 2 MG tablet Take 2 mg by mouth every 6 (six) hours as needed for muscle spasms.   Yes Historical Provider, MD  metoprolol (LOPRESSOR) 50 MG tablet Take 1 tablet (50 mg total) by mouth 2 (two) times daily. 12/30/16   Dorie Rank, MD    Family History Family History  Problem Relation Age of Onset  . COPD Mother   . Depression Mother   . Anxiety disorder Mother   .  Hypertension Father   . Depression Father   . Anxiety disorder Father   . Lung cancer Maternal Grandfather   . Heart attack Paternal Grandmother   . Heart attack Paternal Grandfather   . Autism Brother   . Autism Brother   . Diabetes Other   . Cancer Other   . Heart failure Other   . Gallbladder disease      multiple family members  . Anxiety disorder Cousin   . Drug abuse Cousin   . Colon cancer Neg Hx     Social History Social History  Substance Use Topics  . Smoking status: Former Smoker    Packs/day: 0.25    Years: 6.00    Types: Cigarettes    Start date: 12/01/2007    Quit date: 03/23/2014  .  Smokeless tobacco: Former Systems developer    Types: [place] date: 09/19/2014  . Alcohol use No     Comment: 04-30-16 per pt no but use to     Allergies   Amoxicillin   Review of Systems Review of Systems  All other systems reviewed and are negative.    Physical Exam Updated Vital Signs BP 136/76   Pulse (!) 110   Resp (!) 22   SpO2 98%   Physical Exam  Constitutional: He appears well-developed and well-nourished. No distress.  HENT:  Head: Normocephalic and atraumatic.  Right Ear: External ear normal.  Left Ear: External ear normal.  Eyes: Conjunctivae are normal. Right eye exhibits no discharge. Left eye exhibits no discharge. No scleral icterus.  Neck: Neck supple. No tracheal deviation present.  Cardiovascular: Regular rhythm and intact distal pulses.  Tachycardia present.   Pulmonary/Chest: Effort normal and breath sounds normal. No stridor. No respiratory distress. He has no wheezes. He has no rales.  Abdominal: Soft. Bowel sounds are normal. He exhibits no distension. There is no tenderness. There is no rebound and no guarding.  Musculoskeletal: He exhibits no edema or tenderness.  Neurological: He is alert. He has normal strength. No sensory deficit. Cranial nerve deficit: no gross deficits. He exhibits normal muscle tone. He displays no seizure activity. Coordination normal.  Skin: Skin is warm and dry. No rash noted.  Psychiatric: He has a normal mood and affect.  Nursing note and vitals reviewed.    ED Treatments / Results  Labs (all labs ordered are listed, but only abnormal results are displayed) Labs Reviewed  BASIC METABOLIC PANEL - Abnormal; Notable for the following:       Result Value   Glucose, Bld 178 (*)    All other components within normal limits  CBC - Abnormal; Notable for the following:    RBC 5.91 (*)    Hemoglobin 17.4 (*)    All other components within normal limits  D-DIMER, QUANTITATIVE (NOT AT [facility])    EKG  EKG  Interpretation  Date/Time:  Monday December 30 2016 17:04:46 EDT Ventricular Rate:  143 PR Interval:  134 QRS Duration: 82 QT Interval:  268 QTC Calculation: 413 R Axis:   18 Text Interpretation:  Sinus tachycardia Otherwise normal ECG Since last tracing rate faster Confirmed by Januel Doolan  MD-J, Emiyah Spraggins ([id number]) on 12/30/2016 5:17:14 PM       Radiology Dg Chest 2 View  Result Date: 12/30/2016 CLINICAL DATA:  Acute onset of difficulty breathing. Sore throat and upper chest pain. Initial encounter. EXAM: CHEST  2 VIEW COMPARISON:  Chest radiograph performed 09/22/2016 FINDINGS: The lungs are well-aerated and clear. There is  no evidence of focal opacification, pleural effusion or pneumothorax. The heart is normal in size; the mediastinal contour is within normal limits. No acute osseous abnormalities are seen. IMPRESSION: No acute cardiopulmonary process seen. Electronically Signed   By: Garald Balding M.D.   On: 12/30/2016 06:51    Procedures Procedures (including critical care time)  Medications Ordered in ED Medications  metoprolol (LOPRESSOR) injection 5 mg (5 mg Intravenous Given 12/30/16 1747)  sodium chloride 0.9 % bolus 1,000 mL (0 mLs Intravenous Stopped 12/30/16 1840)  acetaminophen (TYLENOL) tablet 650 mg (650 mg Oral Given 12/30/16 1840)     Initial Impression / Assessment and Plan / ED Course  I have reviewed the triage vital signs and the nursing notes.  Pertinent labs & imaging results that were available during my care of the patient were reviewed by me and considered in my medical decision making (see chart for details).  Clinical Course as of Dec 30 2016  Mon Dec 30, 2016  1743 Patient's EKG was consistent with a sinus tachycardia. I doubt SVT. We will check CBC electrolytes give him a bolus of fluids and a dose of metoprolol.  [JK]    Clinical Course User Index [JK] Dorie Rank, MD    Patient presented to the emergency room with complaints of tachycardia. He has a history of  recurrent sinus tachycardia as well as SVT Laboratory tests are reassuring. No evidence to suggest acute infection or anemia. Chest x-ray does not show pneumonia.  D-dimer is negative. I doubt pulmonary embolism. Patient was given a dose of Toprol with improvement of his symptoms.  Will discharge home with low-dose metoprolol. Discussed follow-up with his primary care doctor and cardiologist  At this time there does not appear to be any evidence of an acute emergency medical condition and the patient appears stable for discharge with appropriate outpatient follow up.   Final Clinical Impressions(s) / ED Diagnoses   Final diagnoses:  Sinus tachycardia    New Prescriptions New Prescriptions   METOPROLOL (LOPRESSOR) 50 MG TABLET    Take 1 tablet (50 mg total) by mouth 2 (two) times daily.     Dorie Rank, MD 12/30/16 2019

## 2016-12-30 NOTE — Discharge Instructions (Signed)
You may have had an allergic reaction to the Amoxicillin you have were taking. Please stop this medication. You should advise your doctors of this reaction in the future and should avoid this medication if possible. You can continue to take Benadryl and Pepcid over the next several days if you continue to experience some symptoms. We have given you a steroid shot which should last for several days.  If you symptoms do not resolve after several days you should return to see your primary doctor. If your symptoms worsen and you have difficulty breathing, you should return to the ED immediately for evaluation.

## 2017-07-27 ENCOUNTER — Encounter (HOSPITAL_COMMUNITY): Payer: Self-pay

## 2017-07-27 ENCOUNTER — Emergency Department (HOSPITAL_COMMUNITY)
Admission: EM | Admit: 2017-07-27 | Discharge: 2017-07-27 | Disposition: A | Discharge status: Home / Self Care | Payer: Self-pay | Attending: Emergency Medicine | Admitting: Emergency Medicine

## 2017-07-27 ENCOUNTER — Other Ambulatory Visit: Payer: Self-pay

## 2017-07-27 DIAGNOSIS — H53452 Other localized visual field defect, left eye: Secondary | ICD-10-CM | POA: Insufficient documentation

## 2017-07-27 DIAGNOSIS — I10 Essential (primary) hypertension: Secondary | ICD-10-CM | POA: Insufficient documentation

## 2017-07-27 DIAGNOSIS — Z87891 Personal history of nicotine dependence: Secondary | ICD-10-CM | POA: Insufficient documentation

## 2017-07-27 DIAGNOSIS — Z79899 Other long term (current) drug therapy: Secondary | ICD-10-CM | POA: Insufficient documentation

## 2017-07-27 HISTORY — DX: Essential (primary) hypertension: I10

## 2017-07-27 HISTORY — DX: Other vitreous opacities, unspecified eye: H43.399

## 2017-07-27 MED ORDER — TETRACAINE HCL 0.5 % OP SOLN
2.0000 [drp] | Freq: Once | OPHTHALMIC | Status: AC
  Administered 2017-07-27: 2 [drp] via OPHTHALMIC
  Filled 2017-07-27: qty 4

## 2017-07-27 MED ORDER — PHENYLEPHRINE HCL 2.5 % OP SOLN
1.0000 [drp] | Freq: Once | OPHTHALMIC | Status: DC
  Filled 2017-07-27: qty 2

## 2017-07-27 MED ORDER — CYCLOPENTOLATE-PHENYLEPHRINE 0.2-1 % OP SOLN
OPHTHALMIC | Status: AC
  Filled 2017-07-27: qty 2

## 2017-07-27 MED ORDER — PHENYLEPHRINE 200 MCG/ML FOR PRIAPISM / HYPOTENSION
INTRAMUSCULAR | Status: AC
  Filled 2017-07-27: qty 50

## 2017-07-27 NOTE — ED Provider Notes (Signed)
[facility] EMERGENCY DEPARTMENT Provider Note   CSN: [id number] Arrival date & time: 07/27/17  1642     History   Chief Complaint Chief Complaint  Patient presents with  . Eye Problem    HPI Samuel Blackwell is a 26 y.o. male.  HPI  26 y.o. Male complaining of history of floaters c.o. Of area of decreased vision left eye that began last night after intercourse.  Patient states there is an area of decreased vision in the left eye at the upper area that began loc and continues.  He describes it at as about 1/8 of the the top middle visual field.  He has some headache that began today and is bitemporal prssure similar to prior headaches.  Took ibuprofen 800 mg wihtout relief.    Past Medical History:  Diagnosis Date  . Anxiety   . Floaters   . GERD (gastroesophageal reflux disease)   . Hiatal hernia   . Hypertension   . Migraines   . Sinus tachycardia    r/t anxiety  . SVT (supraventricular tachycardia) [facility])     Patient Active Problem List   Diagnosis Date Noted  . Dysphagia   . Mucosal abnormality of esophagus   . Hiatal hernia   . Abdominal pain 12/27/2015  . Globus sensation 12/27/2015  . Chest pain 06/14/2015  . GERD (gastroesophageal reflux disease) 06/14/2015  . Anxiety 05/24/2015  . SVT (supraventricular tachycardia) ([place]) 09/29/2014    Past Surgical History:  Procedure Laterality Date  . ABLATION OF DYSRHYTHMIC FOCUS  06/28/2015   SVT       Home Medications    Prior to Admission medications   Medication Sig Start Date End Date Taking? Authorizing Provider  ALPRAZolam [name]) 1 MG tablet Take 1 tablet (1 mg total) by mouth 3 (three) times daily as needed for anxiety. 05/31/16   Cloria Spring, MD  cetirizine (ZYRTEC) 10 MG tablet Take 10 mg by mouth daily.    [provider]  DEXILANT 60 MG capsule Take 1 capsule (60 mg total) by mouth daily. 08/07/16   Annitta Needs, NP  FLUoxetine (PROZAC) 20 MG capsule Take 1 capsule (20 mg total) by  mouth daily. 05/31/16   Cloria Spring, MD  ibuprofen (ADVIL,MOTRIN) 800 MG tablet Take 800 mg by mouth every 8 (eight) hours as needed for mild pain or moderate pain.     [provider]  metoprolol (LOPRESSOR) 50 MG tablet Take 1 tablet (50 mg total) by mouth 2 (two) times daily. 12/30/16   Dorie Rank, MD  tiZANidine (ZANAFLEX) 2 MG tablet Take 2 mg by mouth every 6 (six) hours as needed for muscle spasms.    [provider]    Family History Family History  Problem Relation Age of Onset  . COPD Mother   . Depression Mother   . Anxiety disorder Mother   . Hypertension Father   . Depression Father   . Anxiety disorder Father   . Lung cancer Maternal Grandfather   . Heart attack Paternal Grandmother   . Heart attack Paternal Grandfather   . Autism Brother   . Autism Brother   . Diabetes Other   . Cancer Other   . Heart failure Other   . Gallbladder disease Unknown        multiple family members  . Anxiety disorder Cousin   . Drug abuse Cousin   . Colon cancer Neg Hx     Social History Social History  Tobacco Use  . Smoking status: Former Smoker    Packs/day: 0.25    Years: 6.00    Pack years: 1.50    Types: Cigarettes    Start date: 12/01/2007    Last attempt to quit: 03/23/2014    Years since quitting: 3.3  . Smokeless tobacco: Former Systems developer    Types: Snuff    Quit date: 09/19/2014  Substance Use Topics  . Alcohol use: No    Alcohol/week: 0.0 oz    Comment: 04-30-16 per pt no but use to  . Drug use: No    Comment: 04-30-16 per pt no     Allergies   Amoxicillin   Review of Systems Review of Systems  All other systems reviewed and are negative.    Physical Exam Updated Vital Signs BP (!) 148/83 (BP Location: Left Arm)   Pulse 78   Temp 98 F (36.7 C) (Oral)   Resp 18   Ht 1.702 m (5\' 7" )   Wt 122.5 kg (270 lb)   SpO2 100%   BMI 42.29 kg/m   Physical Exam  Constitutional: He appears well-developed and well-nourished.  HENT:  Head:  Normocephalic and atraumatic.  Eyes: EOM are normal. Pupils are equal, round, and reactive to light.  Fundoscopic exam:      The left eye shows no AV nicking, no exudate, no hemorrhage and no papilledema.  Slit lamp exam:      The left eye shows no corneal abrasion, no corneal flare, no corneal ulcer, no foreign body, no hyphema and no hypopyon.  Neck: Normal range of motion.  Cardiovascular: Normal rate and regular rhythm.  Pulmonary/Chest: Effort normal.  Abdominal: Soft.  Musculoskeletal: Normal range of motion.  Neurological: He is alert.  Skin: Skin is warm. Capillary refill takes less than 2 seconds.  Nursing note and vitals reviewed.    ED Treatments / Results  Labs (all labs ordered are listed, but only abnormal results are displayed) Labs Reviewed - No data to display  EKG  EKG Interpretation None       Radiology No results found.  Procedures Procedures (including critical care time)  Medications Ordered in ED Medications  tetracaine (PONTOCAINE) 0.5 % ophthalmic solution 2 drop (not administered)     Initial Impression / Assessment and Plan / ED Course  I have reviewed the triage vital signs and the nursing notes.  Pertinent labs & imaging results that were available during my care of the patient were reviewed by me and considered in my medical decision making (see chart for details).     EMERGENCY DEPARTMENT [place] OCULAR EXAM "Study: Limited Ultrasound of Orbit "   INDICATIONS: Vision loss and Floaters/Flashes Linear probe utilized to obtain images in both long and short axis of the orbit having the patient look left and right if possible.  PERFORMED BY: Myself IMAGES ARCHIVED?: Yes LIMITATIONS: none VIEWS USED: Right orbit and Left orbit INTERPRETATION: No retinal detachment, Lens in proper position  Discussed with Dr. Manuella Ghazi and he will see in office tomorrow between 8 and 12. Final Clinical Impressions(s) / ED Diagnoses   Final diagnoses:    Peripheral vision loss, left    New Prescriptions This SmartLink is deprecated. Use AVSMEDLIST instead to display the medication list for a patient.   Pattricia Boss, MD 07/27/17 [phone]

## 2017-07-27 NOTE — ED Notes (Signed)
OD:   20/20    OS:  20/20    OU:   20/20

## 2017-07-27 NOTE — ED Triage Notes (Signed)
Patient reports of having sex last then started seeing bright spots in left eye. Also states he has some vision loss in left eye since last night. Complains of headache.

## 2017-09-17 IMAGING — DX DG CHEST 2V
2 series · 2 of 2 positions shown · non-contrast
Comparison: Radiograph 05/07/2015

CLINICAL DATA: 24-year-old male with chest pain

EXAM:
CHEST  2 VIEW

[chest pa]
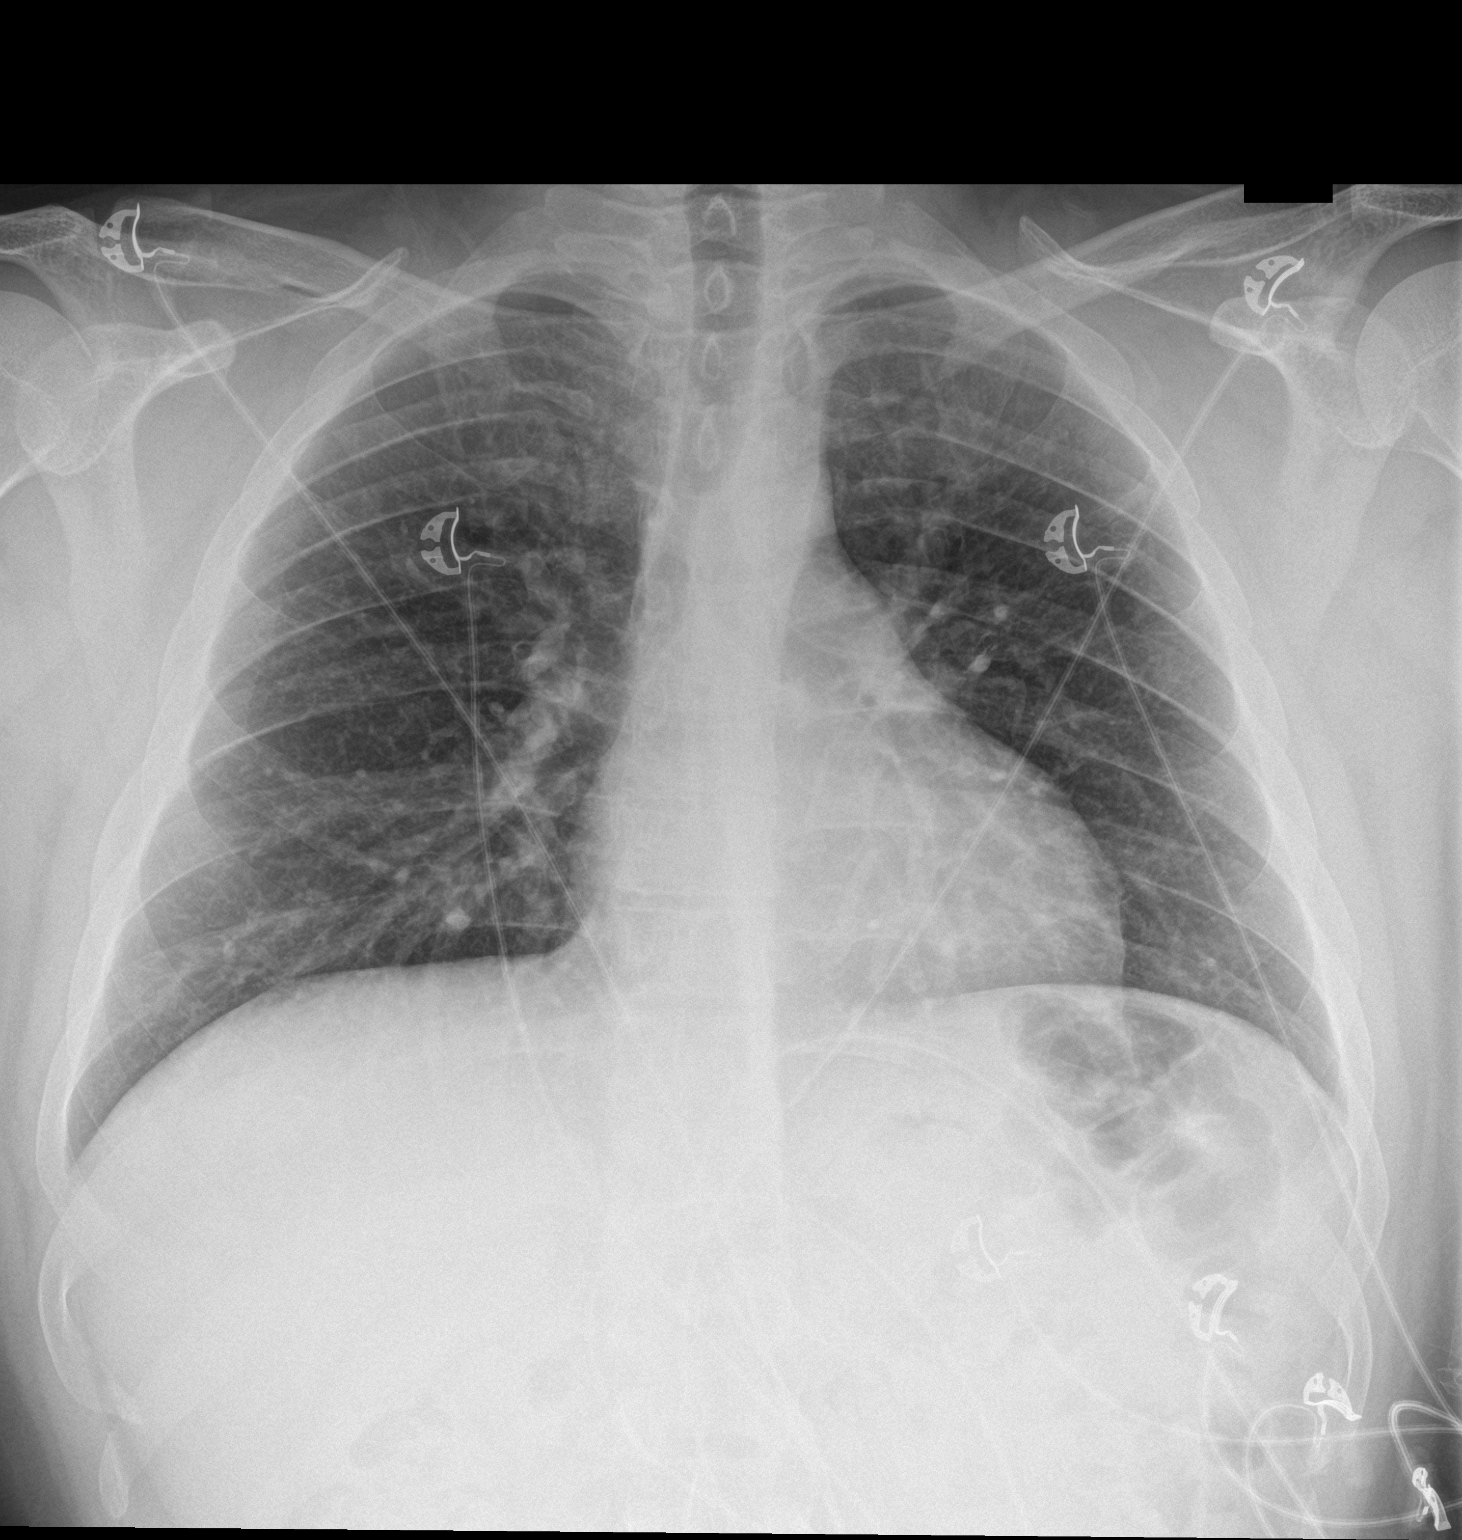

[chest lat]
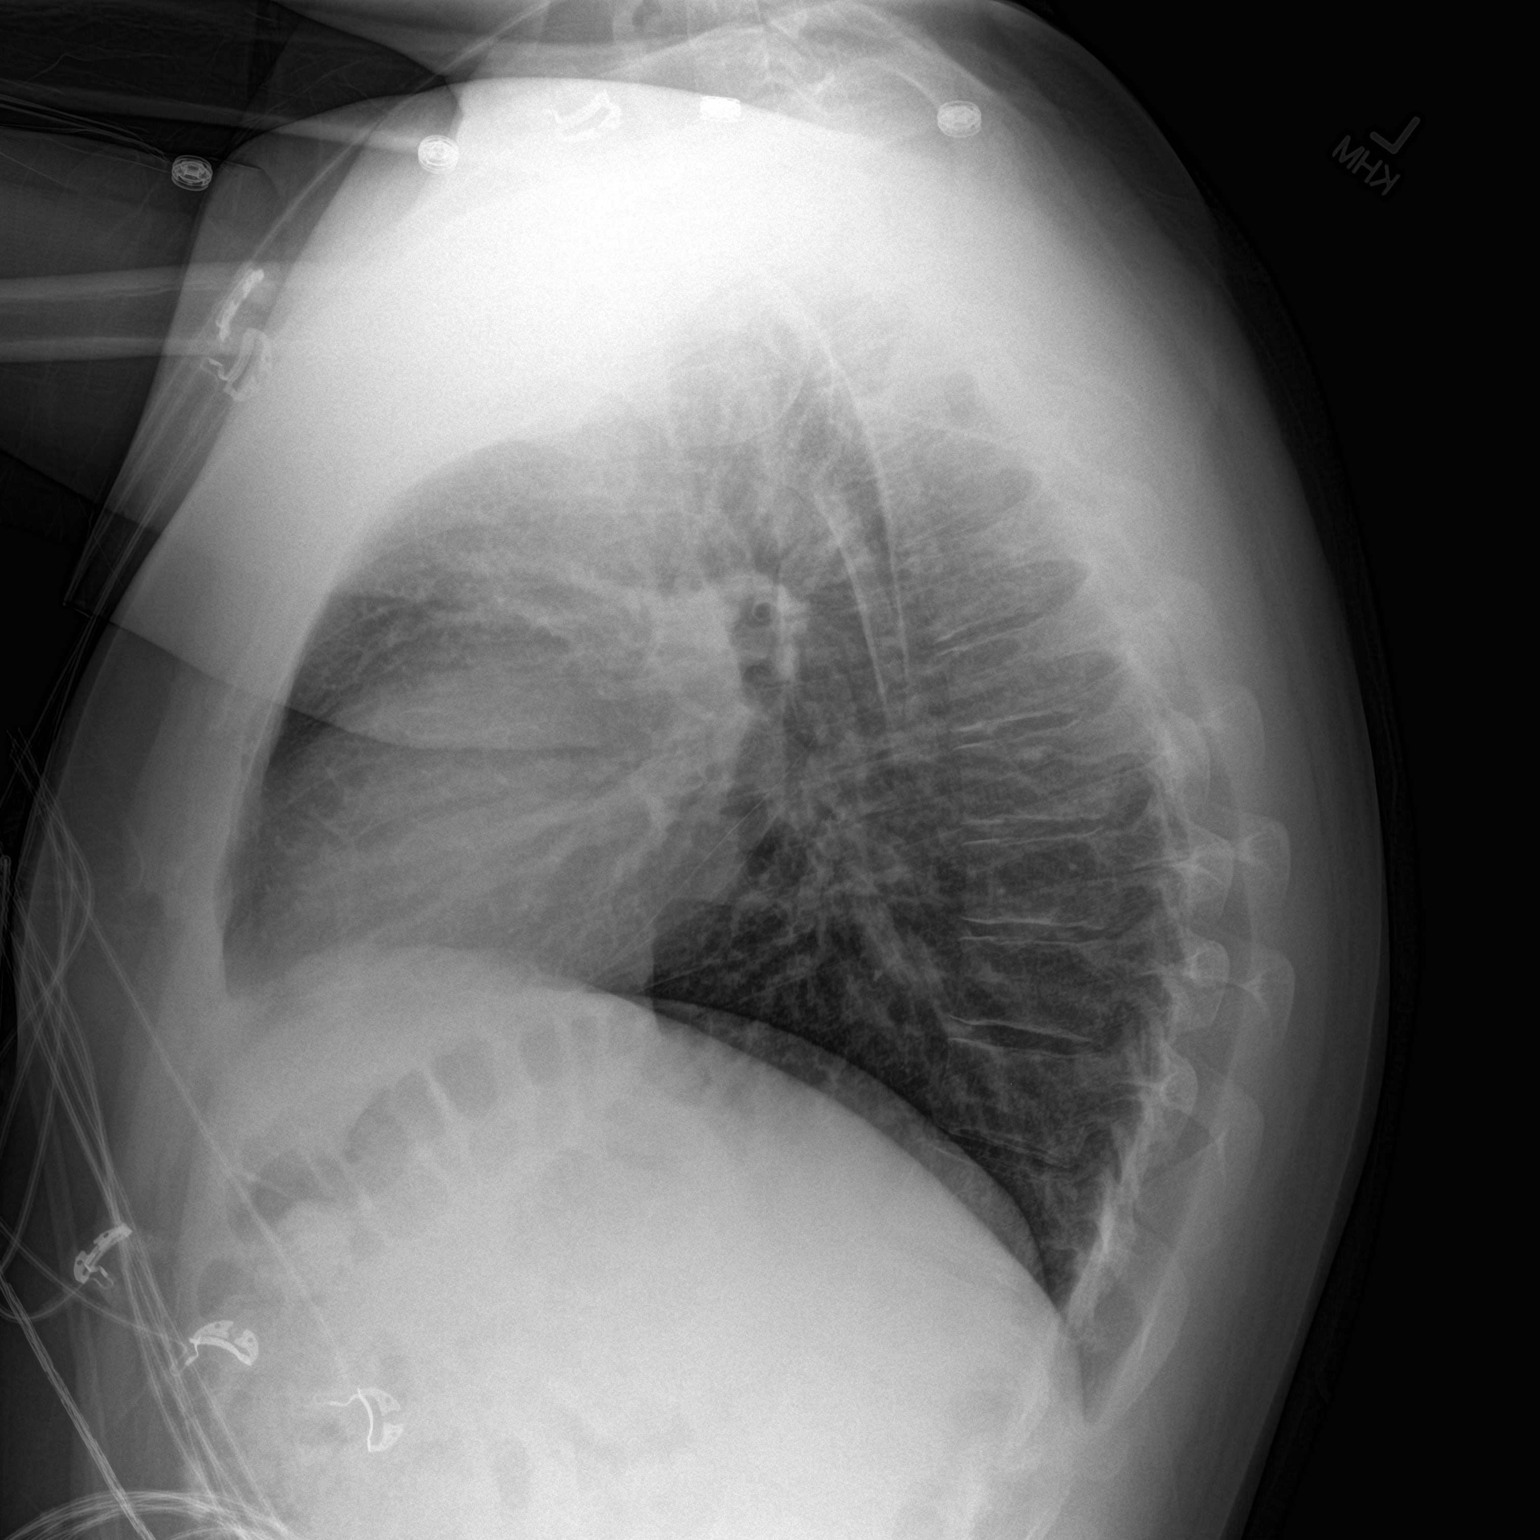

[2 of 2 positions shown; findings below may reference images not displayed]

FINDINGS: The heart size and mediastinal contours are within normal limits.
Both lungs are clear. The visualized skeletal structures are
unremarkable.
IMPRESSION: No active cardiopulmonary disease.

## 2017-09-22 ENCOUNTER — Encounter (HOSPITAL_COMMUNITY): Payer: Self-pay

## 2017-09-22 ENCOUNTER — Emergency Department (HOSPITAL_COMMUNITY)
Admission: EM | Admit: 2017-09-22 | Discharge: 2017-09-22 | Disposition: A | Discharge status: Home / Self Care | Payer: Federal, State, Local not specified - PPO | Attending: Emergency Medicine | Admitting: Emergency Medicine

## 2017-09-22 ENCOUNTER — Emergency Department (HOSPITAL_COMMUNITY): Payer: Federal, State, Local not specified - PPO

## 2017-09-22 DIAGNOSIS — R1033 Periumbilical pain: Secondary | ICD-10-CM | POA: Insufficient documentation

## 2017-09-22 DIAGNOSIS — A09 Infectious gastroenteritis and colitis, unspecified: Secondary | ICD-10-CM | POA: Diagnosis not present

## 2017-09-22 DIAGNOSIS — R110 Nausea: Secondary | ICD-10-CM | POA: Insufficient documentation

## 2017-09-22 DIAGNOSIS — I10 Essential (primary) hypertension: Secondary | ICD-10-CM | POA: Diagnosis not present

## 2017-09-22 DIAGNOSIS — R1031 Right lower quadrant pain: Secondary | ICD-10-CM | POA: Insufficient documentation

## 2017-09-22 DIAGNOSIS — R197 Diarrhea, unspecified: Secondary | ICD-10-CM

## 2017-09-22 DIAGNOSIS — Z87891 Personal history of nicotine dependence: Secondary | ICD-10-CM | POA: Insufficient documentation

## 2017-09-22 DIAGNOSIS — Z79899 Other long term (current) drug therapy: Secondary | ICD-10-CM | POA: Diagnosis not present

## 2017-09-22 DIAGNOSIS — R109 Unspecified abdominal pain: Secondary | ICD-10-CM | POA: Diagnosis not present

## 2017-09-22 LAB — COMPREHENSIVE METABOLIC PANEL
ALBUMIN: 4.1 g/dL (ref 3.5–5.0)
ALT: 77 U/L — ABNORMAL HIGH (ref 17–63)
AST: 36 U/L (ref 15–41)
Alkaline Phosphatase: 124 U/L (ref 38–126)
Anion gap: 7 (ref 5–15)
BUN: 11 mg/dL (ref 6–20)
CHLORIDE: 101 mmol/L (ref 101–111)
CO2: 25 mmol/L (ref 22–32)
Calcium: 9.4 mg/dL (ref 8.9–10.3)
Creatinine, Ser: 0.92 mg/dL (ref 0.61–1.24)
GFR calc Af Amer: >60 mL/min (ref >60)
GFR calc non Af Amer: >60 mL/min (ref >60)
GLUCOSE: 103 mg/dL — AB (ref 65–99)
POTASSIUM: 4.3 mmol/L (ref 3.5–5.1)
Sodium: 133 mmol/L — ABNORMAL LOW (ref 135–145)
Total Bilirubin: 1.1 mg/dL (ref 0.3–1.2)
Total Protein: 6.8 g/dL (ref 6.5–8.1)

## 2017-09-22 LAB — CBC
HEMATOCRIT: 48.8 % (ref 39.0–52.0)
Hemoglobin: 16.7 g/dL (ref 13.0–17.0)
MCH: 29 pg (ref 26.0–34.0)
MCHC: 34.2 g/dL (ref 30.0–36.0)
MCV: 84.9 fL (ref 78.0–100.0)
Platelets: 278 10*3/uL (ref 150–400)
RBC: 5.75 MIL/uL (ref 4.22–5.81)
RDW: 12.8 % (ref 11.5–15.5)
WBC: 14.8 10*3/uL — AB (ref 4.0–10.5)

## 2017-09-22 LAB — URINALYSIS, ROUTINE W REFLEX MICROSCOPIC
Bilirubin Urine: NEGATIVE
GLUCOSE, UA: NEGATIVE mg/dL
Hgb urine dipstick: NEGATIVE
Ketones, ur: NEGATIVE mg/dL
LEUKOCYTES UA: NEGATIVE
NITRITE: NEGATIVE
PH: 5 (ref 5.0–8.0)
Protein, ur: NEGATIVE mg/dL
SPECIFIC GRAVITY, URINE: 1.023 (ref 1.005–1.030)

## 2017-09-22 LAB — LIPASE, BLOOD: Lipase: 31 U/L (ref 11–51)

## 2017-09-22 MED ORDER — ONDANSETRON HCL 4 MG/2ML IJ SOLN
4.0000 mg | Freq: Once | INTRAMUSCULAR | Status: AC
  Administered 2017-09-22: 4 mg via INTRAVENOUS
  Filled 2017-09-22: qty 2

## 2017-09-22 MED ORDER — FENTANYL CITRATE (PF) 100 MCG/2ML IJ SOLN
50.0000 ug | Freq: Once | INTRAMUSCULAR | Status: AC
  Administered 2017-09-22: 50 ug via INTRAVENOUS
  Filled 2017-09-22: qty 2

## 2017-09-22 MED ORDER — SODIUM CHLORIDE 0.9 % IV BOLUS (SEPSIS)
1000.0000 mL | Freq: Once | INTRAVENOUS | Status: AC
  Administered 2017-09-22: 1000 mL via INTRAVENOUS

## 2017-09-22 MED ORDER — IOPAMIDOL (ISOVUE-300) INJECTION 61%
INTRAVENOUS | Status: AC
  Administered 2017-09-22: 100 mL
  Filled 2017-09-22: qty 100

## 2017-09-22 NOTE — Discharge Instructions (Signed)
As discussed, make sure that you stay well-hydrated and get some rest.  Follow up with your primary care provider.  Return sooner if you experience worsening symptoms or new concerning symptoms in the meantime.

## 2017-09-22 NOTE — ED Provider Notes (Signed)
[facility] EMERGENCY DEPARTMENT Provider Note   CSN: [id number] Arrival date & time: 09/22/17  1517     History   Chief Complaint Chief Complaint  Patient presents with  . Abdominal Pain    HPI Samuel Blackwell is a 26 y.o. male with past medical history of GERD, hiatal hernia, hypertension, migraines, SVT status post ablation and anxiety presenting with 2-3 days of nausea and some diarrhea no vomiting and severe right flank pain onset today.  Patient reports that he has had discomfort such as this in the past intermittently for a year but not as severe.  His pain is aggravated by deep breaths or any movement.  He reports having difficulty with walking and small bumps on the road during the car ride.  When he lays still the pain improves.  He has not tried anything for his symptoms.  Reported history of kidney stone but he states that the pain does not feel like a stone to him.  Family history of appendicitis. Denies fever, chills, vomiting, dysuria, hematuria or other symptoms. Denies any prior abdominal surgeries.  HPI  Past Medical History:  Diagnosis Date  . Anxiety   . Floaters   . GERD (gastroesophageal reflux disease)   . Hiatal hernia   . Hypertension   . Migraines   . Sinus tachycardia    r/t anxiety  . SVT (supraventricular tachycardia) [facility])     Patient Active Problem List   Diagnosis Date Noted  . Dysphagia   . Mucosal abnormality of esophagus   . Hiatal hernia   . Abdominal pain 12/27/2015  . Globus sensation 12/27/2015  . Chest pain 06/14/2015  . GERD (gastroesophageal reflux disease) 06/14/2015  . Anxiety 05/24/2015  . SVT (supraventricular tachycardia) ([place]) 09/29/2014    Past Surgical History:  Procedure Laterality Date  . ABLATION OF DYSRHYTHMIC FOCUS  06/28/2015   SVT  . ELECTROPHYSIOLOGIC STUDY N/A 06/28/2015   Procedure: SVT Ablation;  Surgeon: Evans Lance, MD;  Location: [facility];  Service: Cardiovascular;   Laterality: N/A;  . ESOPHAGOGASTRODUODENOSCOPY (EGD) WITH PROPOFOL N/A 01/22/2016   Dr. Gala Romney: negative Barrett's, small hiatal hernia  . MALONEY DILATION N/A 01/22/2016   Procedure: Venia Minks DILATION;  Surgeon: Daneil Dolin, MD;  Location: [facility];  Service: Endoscopy;  Laterality: N/A;       Home Medications    Prior to Admission medications   Medication Sig Start Date End Date Taking? Authorizing Provider  ALPRAZolam [name]) 1 MG tablet Take 1 tablet (1 mg total) by mouth 3 (three) times daily as needed for anxiety. 05/31/16   Cloria Spring, MD  DEXILANT 60 MG capsule Take 1 capsule (60 mg total) by mouth daily. 08/07/16   Annitta Needs, NP  FLUoxetine (PROZAC) 20 MG capsule Take 1 capsule (20 mg total) by mouth daily. 05/31/16   Cloria Spring, MD  ibuprofen (ADVIL,MOTRIN) 800 MG tablet Take 800 mg by mouth every 8 (eight) hours as needed for mild pain or moderate pain.     [provider]  metoprolol (LOPRESSOR) 50 MG tablet Take 1 tablet (50 mg total) by mouth 2 (two) times daily. Patient taking differently: Take 25 mg 2 (two) times daily by mouth.  12/30/16   Dorie Rank, MD    Family History Family History  Problem Relation Age of Onset  . COPD Mother   . Depression Mother   . Anxiety disorder Mother   . Hypertension Father   .  Depression Father   . Anxiety disorder Father   . Lung cancer Maternal Grandfather   . Heart attack Paternal Grandmother   . Heart attack Paternal Grandfather   . Autism Brother   . Autism Brother   . Diabetes Other   . Cancer Other   . Heart failure Other   . Gallbladder disease Unknown        multiple family members  . Anxiety disorder Cousin   . Drug abuse Cousin   . Colon cancer Neg Hx     Social History Social History   Tobacco Use  . Smoking status: Former Smoker    Packs/day: 0.25    Years: 6.00    Pack years: 1.50    Types: Cigarettes    Start date: 12/01/2007    Last attempt to quit: 03/23/2014    Years since  quitting: 3.5  . Smokeless tobacco: Former Systems developer    Types: Snuff    Quit date: 09/19/2014  Substance Use Topics  . Alcohol use: No    Alcohol/week: 0.0 oz    Comment: 04-30-16 per pt no but use to  . Drug use: No    Comment: 04-30-16 per pt no     Allergies   Amoxicillin and Penicillins   Review of Systems Review of Systems  Constitutional: Negative for chills, diaphoresis and fever.  Respiratory: Negative for cough, shortness of breath, wheezing and stridor.   Cardiovascular: Negative for chest pain and palpitations.  Gastrointestinal: Positive for abdominal pain, diarrhea and nausea. Negative for abdominal distention, blood in stool and vomiting.  Genitourinary: Positive for flank pain. Negative for decreased urine volume, difficulty urinating, dysuria, frequency and hematuria.       Points to his right flank as the area of pain  Musculoskeletal: Negative for arthralgias, back pain, myalgias, neck pain and neck stiffness.  Skin: Negative for color change, pallor and rash.  Neurological: Negative for dizziness, seizures, syncope, light-headedness and headaches.     Physical Exam Updated Vital Signs BP 135/82 (BP Location: Right Arm)   Pulse 79   Temp 99.3 F (37.4 C) (Oral)   Resp 17   Ht 5\' 1"  (1.549 m)   Wt 117.9 kg (260 lb)   SpO2 97%   BMI 49.13 kg/m   Physical Exam  Constitutional: He appears well-developed and well-nourished.  Non-toxic appearance. He does not appear ill. No distress.  Afebrile, nontoxic-appearing, sitting comfortably in bed no acute distress.  HENT:  Head: Normocephalic and atraumatic.  Eyes: Conjunctivae are normal. No scleral icterus.  Neck: Neck supple.  Cardiovascular: Normal rate and regular rhythm.  No murmur heard. Pulmonary/Chest: Effort normal and breath sounds normal. No stridor. No respiratory distress. He has no wheezes. He has no rhonchi. He has no rales.  Abdominal: Soft. Normal appearance and bowel sounds are normal. There is  tenderness in the right lower quadrant and periumbilical area. There is CVA tenderness and tenderness at McBurney's point. There is no rigidity, no rebound, no guarding and negative Murphy's sign.  Musculoskeletal: He exhibits no edema.  Neurological: He is alert.  Skin: Skin is warm and dry. No rash noted. He is not diaphoretic. No cyanosis or erythema. No pallor.  Psychiatric: He has a normal mood and affect.  Nursing note and vitals reviewed.    ED Treatments / Results  Labs (all labs ordered are listed, but only abnormal results are displayed) Labs Reviewed  COMPREHENSIVE METABOLIC PANEL - Abnormal; Notable for the following components:  Result Value   Sodium 133 (*)    Glucose, Bld 103 (*)    ALT 77 (*)    All other components within normal limits  CBC - Abnormal; Notable for the following components:   WBC 14.8 (*)    All other components within normal limits  LIPASE, BLOOD  URINALYSIS, ROUTINE W REFLEX MICROSCOPIC    EKG  EKG Interpretation None       Radiology Ct Abdomen Pelvis W Contrast  Result Date: 09/22/2017 CLINICAL DATA:  Right lower quadrant pain x1 day. EXAM: CT ABDOMEN AND PELVIS WITH CONTRAST TECHNIQUE: Multidetector CT imaging of the abdomen and pelvis was performed using the standard protocol following bolus administration of intravenous contrast. CONTRAST:  169mL ISOVUE-300 IOPAMIDOL (ISOVUE-300) INJECTION 61% COMPARISON:  11/27/2013 CT FINDINGS: Lower chest: No acute abnormality. Hepatobiliary: Homogeneous enhancement of the liver without space-occupying mass. No biliary dilatation. Gallbladder is unremarkable. Pancreas: Normal appearing pancreas without inflammation or ductal dilatation. No enhancing mass. Spleen: Normal size spleen without mass. Adrenals/Urinary Tract: Normal bilateral adrenal glands. Symmetric enhancement of both kidneys. No nephrolithiasis nor obstructive uropathy. No enhancing mass lesions. Unremarkable appearance of the bladder.  Stomach/Bowel: Normal appendix. Nondistended stomach with normal small bowel rotation. No bowel obstruction or inflammation. Vascular/Lymphatic: Mildly enlarged right lower quadrant mesenteric lymph nodes measuring up to 1.1 cm short axis that may represent mesenteric adenitis. Unremarkable aorta and branch vessels. Reproductive: Prostate and seminal vesicles are unremarkable. Other: No pneumoperitoneum. No abdominopelvic ascites. Minimal skin thickening at the base of the scrotum. Musculoskeletal: No acute or significant osseous findings. IMPRESSION: 1. Mild prominence of right lower quadrant lymph nodes that may reflect a mesenteric adenitis. 2. No acute bowel inflammation or obstruction.  Normal appendix. Electronically Signed   By: Ashley Royalty M.D.   On: 09/22/2017 13:25    Procedures Procedures (including critical care time)  Medications Ordered in ED Medications  sodium chloride 0.9 % bolus 1,000 mL (1,000 mLs Intravenous New Bag/Given 09/22/17 1247)  fentaNYL (SUBLIMAZE) injection 50 mcg (50 mcg Intravenous Given 09/22/17 1245)  ondansetron (ZOFRAN) injection 4 mg (4 mg Intravenous Given 09/22/17 1244)  iopamidol (ISOVUE-300) 61 % injection (100 mLs  Contrast Given 09/22/17 1245)     Initial Impression / Assessment and Plan / ED Course  I have reviewed the triage vital signs and the nursing notes.  Pertinent labs & imaging results that were available during my care of the patient were reviewed by me and considered in my medical decision making (see chart for details).    26 year old male presenting with sudden onset right lower quadrant pain, tenderness at McBurney's point and periumbilical tenderness.  Nausea with no vomiting and some diarrhea yesterday. Leukocytosis on CBC  Made n.p.o., ordered IV fluids, analgesia and antiemetic, CT abdomen pelvis and will reassess  On reassessment, patient reported improvement  CT: Negative Labs unremarkable other than leukocytosis Symptoms  consistent with viral gastroenteritis  Discharge home with close follow-up with PCP.  Advised hydration and rest.  Discussed strict return precautions and advised to return to the emergency department if experiencing any new or worsening symptoms. Instructions were understood and patient agreed with discharge plan. Final Clinical Impressions(s) / ED Diagnoses   Final diagnoses:  Diarrhea of presumed infectious origin  Right lower quadrant abdominal pain    ED Discharge Orders    None       Dossie Der 09/22/17 [place], Wenda Overland, MD 09/23/17 1036

## 2017-09-22 NOTE — ED Triage Notes (Signed)
Pt presents for evaluation of R sided abd pain since this AM. Pt reports diarrhea and nausea x 2-3 days, denies vomiting. Pt denies dysuria.

## 2017-09-22 NOTE — ED Notes (Signed)
Patient ok for discharge. Discussed staying hydrated and rest. Patient will see PCP in next few days. Briefly discussed stress and how it can affect body. Patient and spouse state understanding.

## 2017-09-24 NOTE — ED Notes (Signed)
65mcg of fentanyl wasted with Rolene Arbour, RN.

## 2017-10-16 DIAGNOSIS — R110 Nausea: Secondary | ICD-10-CM | POA: Diagnosis not present

## 2017-10-16 DIAGNOSIS — F411 Generalized anxiety disorder: Secondary | ICD-10-CM | POA: Diagnosis not present

## 2017-10-16 DIAGNOSIS — K219 Gastro-esophageal reflux disease without esophagitis: Secondary | ICD-10-CM | POA: Diagnosis not present

## 2017-10-16 DIAGNOSIS — R1011 Right upper quadrant pain: Secondary | ICD-10-CM | POA: Diagnosis not present

## 2017-10-23 DIAGNOSIS — R110 Nausea: Secondary | ICD-10-CM | POA: Diagnosis not present

## 2017-10-23 DIAGNOSIS — R1011 Right upper quadrant pain: Secondary | ICD-10-CM | POA: Diagnosis not present

## 2017-10-23 DIAGNOSIS — K219 Gastro-esophageal reflux disease without esophagitis: Secondary | ICD-10-CM | POA: Diagnosis not present

## 2017-11-17 ENCOUNTER — Telehealth: Payer: Self-pay | Admitting: Internal Medicine

## 2017-11-17 NOTE — Telephone Encounter (Signed)
Pt was notified that there are no Dexilant samples in the office and it would have to be approved first before providing pt with samples since pts last ov was 2017.

## 2017-11-17 NOTE — Telephone Encounter (Signed)
Pt called to say he is having problems with [facility] in getting his prescription of Dexilant filled. He is asking for samples. We haven't seen him since AUG 2017 and I told him that I would have to make him an OV to further his refills. He said that his PCP writes the prescription for Dexilant, but wants to get samples from [place]. Please advise if we can do this. [phone]

## 2017-11-26 ENCOUNTER — Ambulatory Visit: Payer: Self-pay | Admitting: Nurse Practitioner

## 2017-12-05 DIAGNOSIS — K011 Impacted teeth: Secondary | ICD-10-CM | POA: Diagnosis not present

## 2018-01-21 DIAGNOSIS — T1501XA Foreign body in cornea, right eye, initial encounter: Secondary | ICD-10-CM | POA: Diagnosis not present

## 2018-01-26 DIAGNOSIS — T1501XD Foreign body in cornea, right eye, subsequent encounter: Secondary | ICD-10-CM | POA: Diagnosis not present

## 2018-02-04 DIAGNOSIS — K219 Gastro-esophageal reflux disease without esophagitis: Secondary | ICD-10-CM | POA: Diagnosis not present

## 2018-02-04 DIAGNOSIS — F411 Generalized anxiety disorder: Secondary | ICD-10-CM | POA: Diagnosis not present

## 2018-02-04 DIAGNOSIS — Z0000 Encounter for general adult medical examination without abnormal findings: Secondary | ICD-10-CM | POA: Diagnosis not present

## 2018-02-04 DIAGNOSIS — Z6837 Body mass index (BMI) 37.0-37.9, adult: Secondary | ICD-10-CM | POA: Diagnosis not present

## 2018-02-04 DIAGNOSIS — R1084 Generalized abdominal pain: Secondary | ICD-10-CM | POA: Diagnosis not present

## 2018-02-26 ENCOUNTER — Encounter: Payer: Self-pay | Admitting: Internal Medicine

## 2018-03-29 IMAGING — CT CT NECK W/ CM
3 of 4 series · 13 of 33 positions shown, 16 images · IV contrast (Omnipaque 300)
Comparison: None.

CLINICAL DATA: Initial evaluation for 1 week history of sore
throat.

EXAM:
CT NECK WITH CONTRAST
TECHNIQUE: Multidetector CT imaging of the neck was performed using the
standard protocol following the bolus administration of intravenous
contrast.
CONTRAST:  75mL OMNIPAQUE IOHEXOL 300 MG/ML  SOLN

[Series 2: soft tissue neck 2.0 b31s · axial · 0.61mm/px · z∈[+48,+198]mm · 5 of 113 slices shown, 7 images]
[im 19/113  soft-tissue]
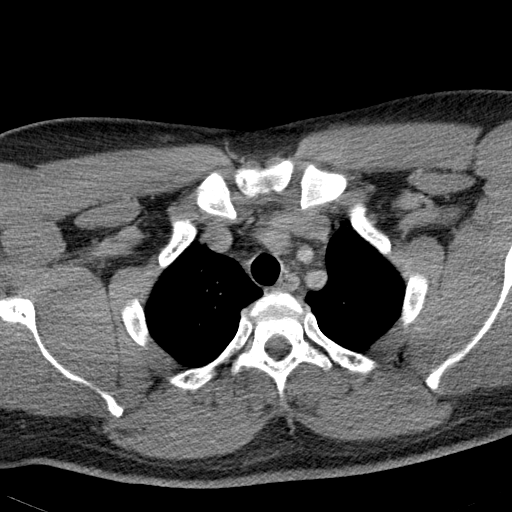
[im 19/113  bone]
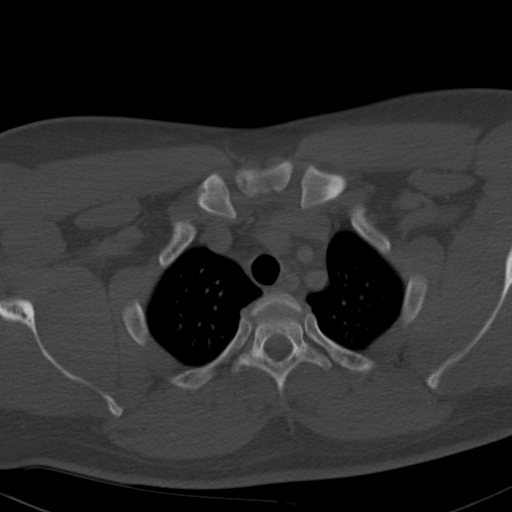
[im 38/113  bone]
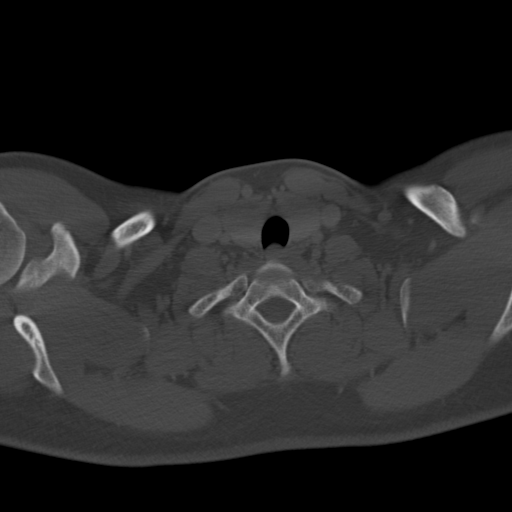
[im 57/113  bone]
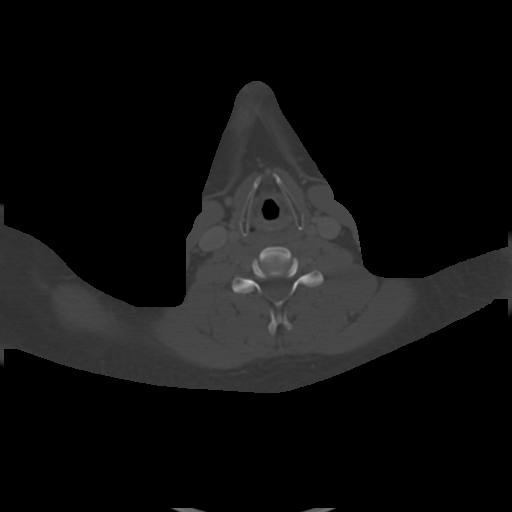
[im 75/113  bone]
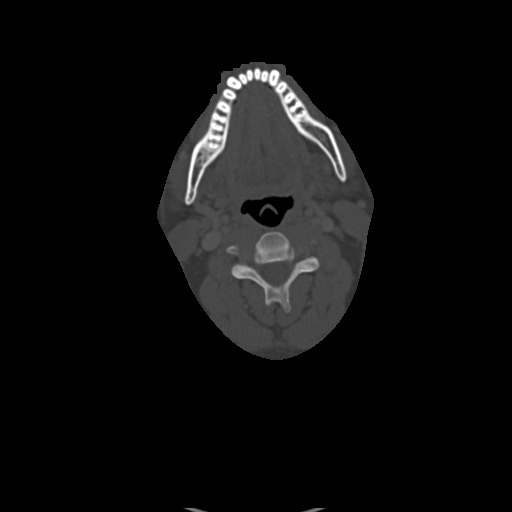
[im 94/113  soft-tissue]
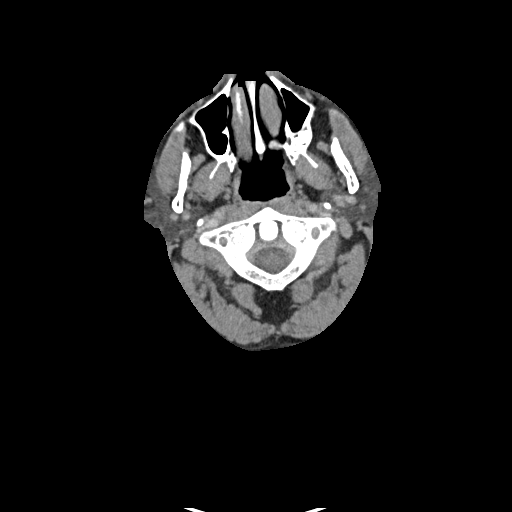
[im 94/113  bone]
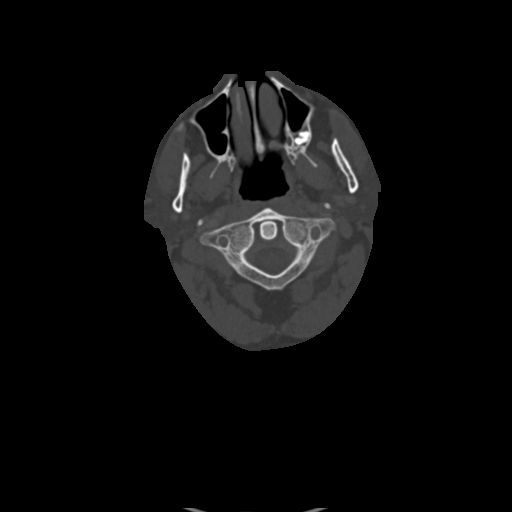

[Series 4: neck 2.0 soft tissue sag · sagittal · 0.44mm/px · 5 of 141 slices shown, 6 images]
[im 47/141  bone]
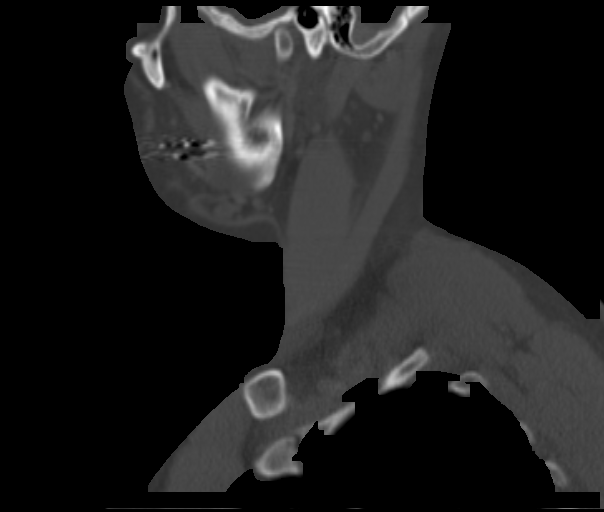
[im 59/141  bone]
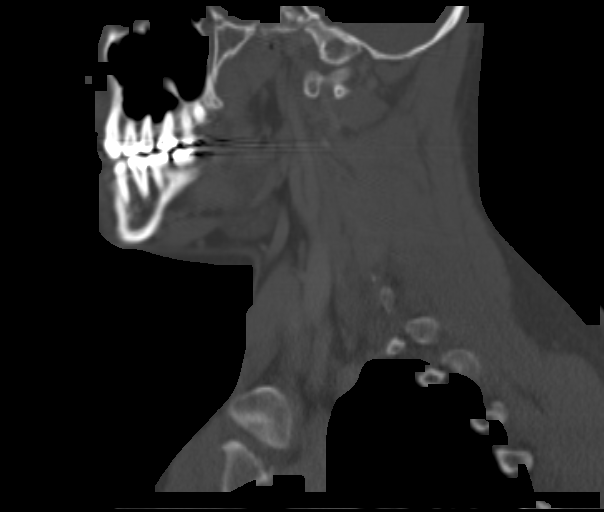
[im 71/141  soft-tissue]
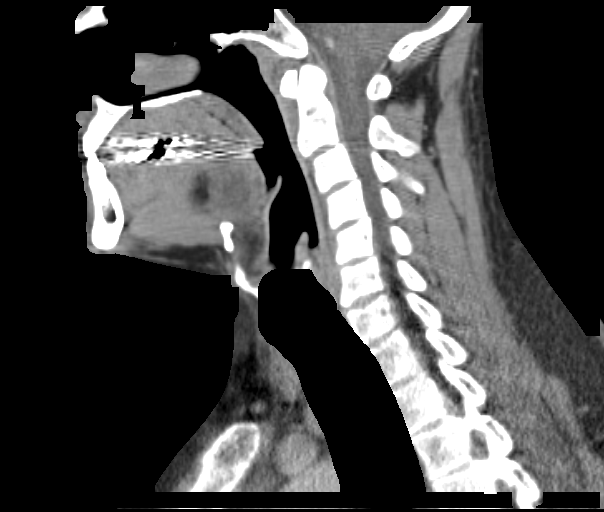
[im 71/141  bone]
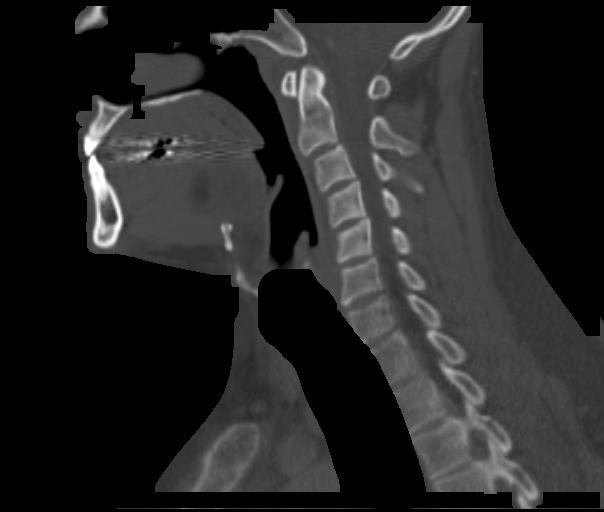
[im 82/141  bone]
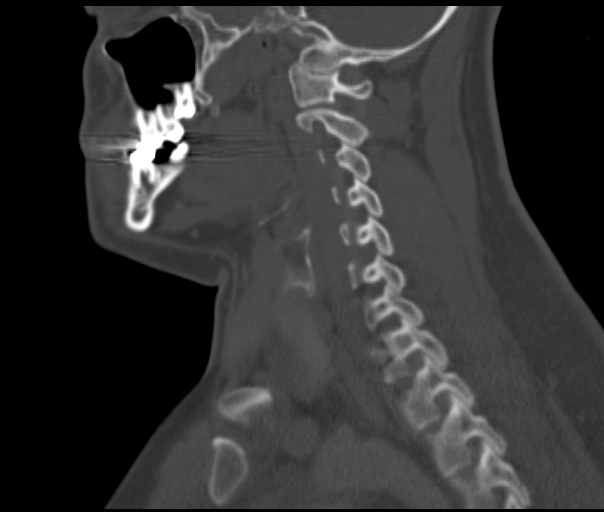
[im 94/141  bone]
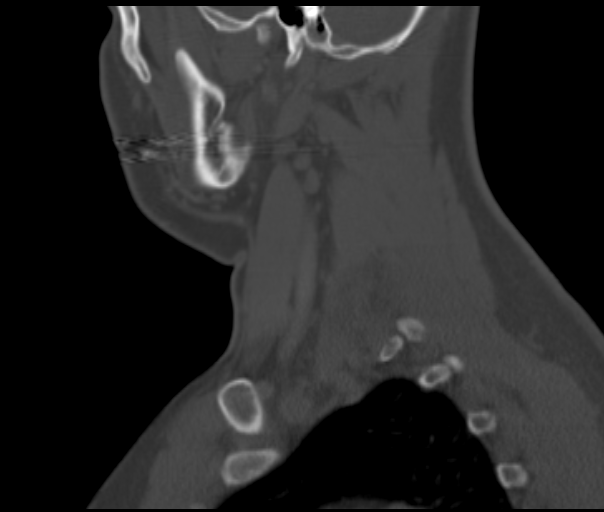

[Series 5: neck 2.0 soft tissue coro · coronal · 0.44mm/px · 3 of 137 slices shown]
[im 28/137  bone]
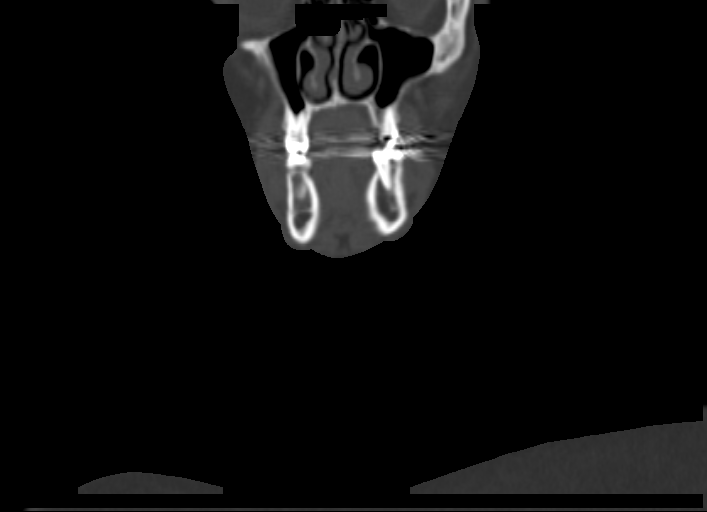
[im 55/137  bone]
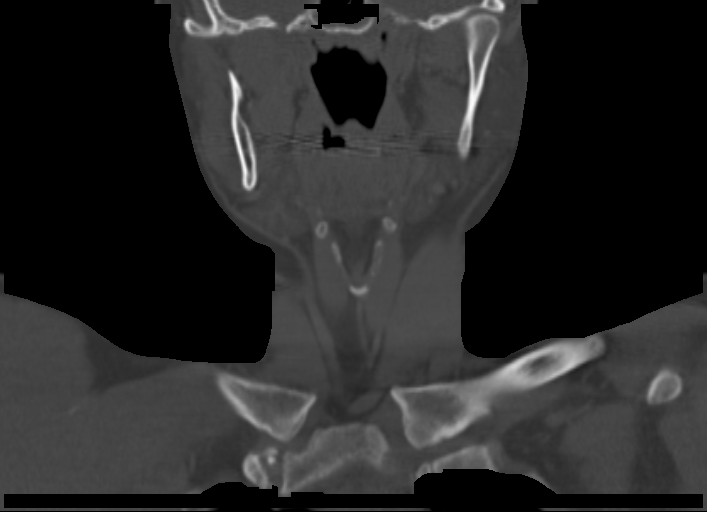
[im 82/137  bone]
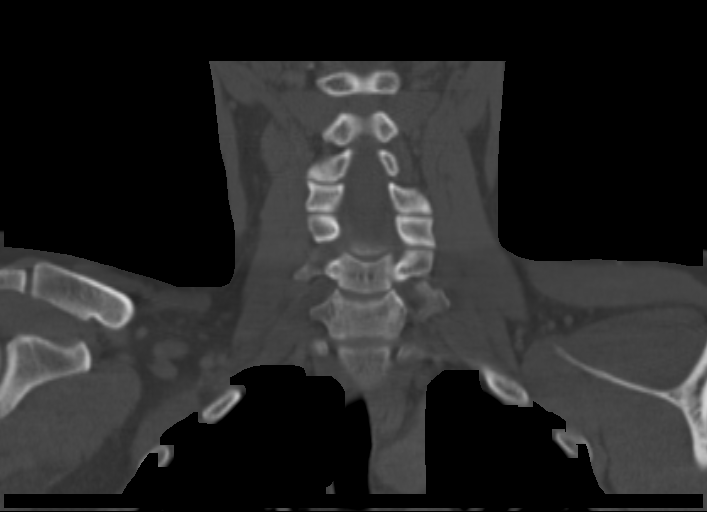

[13 of 33 positions shown; findings below may reference images not displayed]

FINDINGS: Visualized portions of the brain and posterior fossa are normal.

Partially visualized globes and orbits within normal limits.

Paranasal sinuses are clear. No mastoid effusion. Partially
visualized middle ear cavities are well pneumatized.

Salivary glands including the parotid glands and submandibular
glands are normal.

Oral cavity within normal limits. No acute abnormality about the
dentition. Palatine tonsils symmetric and within normal limits.
Parapharyngeal fat preserved bilaterally. Nasopharynx within normal
limits. Remainder of the oropharynx unremarkable without significant
mucosal edema. No retropharyngeal fluid collection. Epiglottis
normal. Vallecular within normal limits. Remainder of the
hypopharynx and supraglottic larynx are normal. True cords within
normal limits. Subglottic airway clear.

Subcentimeter hypodense nodule noted within the right lobe of
thyroid, of doubtful clinical significance. Thyroid gland otherwise
unremarkable.

No pathologically enlarged lymph nodes identified within neck.

Visualized superior mediastinum within normal limits.

Visualized lungs are clear. Normal intravascular enhancement seen
throughout the neck.

No acute osseous abnormality. No worrisome lytic or blastic osseous
lesions.
IMPRESSION: Normal CT of the neck. No significant inflammatory changes or other
abnormality identified.

## 2018-04-13 DIAGNOSIS — N341 Nonspecific urethritis: Secondary | ICD-10-CM | POA: Diagnosis not present

## 2018-04-13 DIAGNOSIS — Z6839 Body mass index (BMI) 39.0-39.9, adult: Secondary | ICD-10-CM | POA: Diagnosis not present

## 2018-04-16 DIAGNOSIS — Z1331 Encounter for screening for depression: Secondary | ICD-10-CM | POA: Diagnosis not present

## 2018-04-16 DIAGNOSIS — R1011 Right upper quadrant pain: Secondary | ICD-10-CM | POA: Diagnosis not present

## 2018-04-16 DIAGNOSIS — R110 Nausea: Secondary | ICD-10-CM | POA: Diagnosis not present

## 2018-04-16 DIAGNOSIS — K219 Gastro-esophageal reflux disease without esophagitis: Secondary | ICD-10-CM | POA: Diagnosis not present

## 2018-04-16 DIAGNOSIS — I10 Essential (primary) hypertension: Secondary | ICD-10-CM | POA: Diagnosis not present

## 2018-04-16 DIAGNOSIS — Z1389 Encounter for screening for other disorder: Secondary | ICD-10-CM | POA: Diagnosis not present

## 2018-04-27 DIAGNOSIS — Z202 Contact with and (suspected) exposure to infections with a predominantly sexual mode of transmission: Secondary | ICD-10-CM | POA: Diagnosis not present

## 2018-09-21 DIAGNOSIS — J0101 Acute recurrent maxillary sinusitis: Secondary | ICD-10-CM | POA: Diagnosis not present

## 2018-09-21 DIAGNOSIS — J209 Acute bronchitis, unspecified: Secondary | ICD-10-CM | POA: Diagnosis not present

## 2018-09-21 DIAGNOSIS — Z6841 Body Mass Index (BMI) 40.0 and over, adult: Secondary | ICD-10-CM | POA: Diagnosis not present

## 2018-10-16 ENCOUNTER — Emergency Department (HOSPITAL_COMMUNITY)
Admission: EM | Admit: 2018-10-16 | Discharge: 2018-10-16 | Disposition: A | Discharge status: Home / Self Care | Payer: 59 | Attending: Emergency Medicine | Admitting: Emergency Medicine

## 2018-10-16 ENCOUNTER — Encounter (HOSPITAL_COMMUNITY): Payer: Self-pay

## 2018-10-16 ENCOUNTER — Other Ambulatory Visit: Payer: Self-pay

## 2018-10-16 ENCOUNTER — Emergency Department (HOSPITAL_COMMUNITY): Payer: 59

## 2018-10-16 DIAGNOSIS — Z87891 Personal history of nicotine dependence: Secondary | ICD-10-CM | POA: Diagnosis not present

## 2018-10-16 DIAGNOSIS — R1011 Right upper quadrant pain: Secondary | ICD-10-CM | POA: Diagnosis not present

## 2018-10-16 DIAGNOSIS — I10 Essential (primary) hypertension: Secondary | ICD-10-CM | POA: Diagnosis not present

## 2018-10-16 DIAGNOSIS — F419 Anxiety disorder, unspecified: Secondary | ICD-10-CM | POA: Insufficient documentation

## 2018-10-16 LAB — COMPREHENSIVE METABOLIC PANEL
ALT: 44 U/L (ref 0–44)
AST: 24 U/L (ref 15–41)
Albumin: 4.1 g/dL (ref 3.5–5.0)
Alkaline Phosphatase: 87 U/L (ref 38–126)
Anion gap: 7 (ref 5–15)
BUN: 15 mg/dL (ref 6–20)
CHLORIDE: 104 mmol/L (ref 98–111)
CO2: 26 mmol/L (ref 22–32)
Calcium: 9 mg/dL (ref 8.9–10.3)
Creatinine, Ser: 0.93 mg/dL (ref 0.61–1.24)
Glucose, Bld: 106 mg/dL — ABNORMAL HIGH (ref 70–99)
Potassium: 4.3 mmol/L (ref 3.5–5.1)
Sodium: 137 mmol/L (ref 135–145)
TOTAL PROTEIN: 7.3 g/dL (ref 6.5–8.1)
Total Bilirubin: 2.7 mg/dL — ABNORMAL HIGH (ref 0.3–1.2)

## 2018-10-16 LAB — CBC
HCT: 48.6 % (ref 39.0–52.0)
Hemoglobin: 15.8 g/dL (ref 13.0–17.0)
MCH: 27.8 pg (ref 26.0–34.0)
MCHC: 32.5 g/dL (ref 30.0–36.0)
MCV: 85.6 fL (ref 80.0–100.0)
PLATELETS: 209 10*3/uL (ref 150–400)
RBC: 5.68 MIL/uL (ref 4.22–5.81)
RDW: 13.2 % (ref 11.5–15.5)
WBC: 7.9 10*3/uL (ref 4.0–10.5)
nRBC: 0 % (ref 0.0–0.2)

## 2018-10-16 LAB — LIPASE, BLOOD: LIPASE: 22 U/L (ref 11–51)

## 2018-10-16 MED ORDER — ONDANSETRON 4 MG PO TBDP: 4.0000 mg | ORAL_TABLET | Freq: Three times a day (TID) | ORAL | 0 refills | Status: DC | PRN

## 2018-10-16 MED ORDER — IOPAMIDOL (ISOVUE-300) INJECTION 61%
100.0000 mL | Freq: Once | INTRAVENOUS | Status: AC | PRN
  Administered 2018-10-16: 100 mL via INTRAVENOUS

## 2018-10-16 MED ORDER — DIPHENHYDRAMINE HCL 25 MG PO TABS: 25.0000 mg | ORAL_TABLET | Freq: Four times a day (QID) | ORAL | 0 refills | Status: DC

## 2018-10-16 MED ORDER — ONDANSETRON 4 MG PO TBDP
4.0000 mg | ORAL_TABLET | Freq: Once | ORAL | Status: AC
  Administered 2018-10-16: 4 mg via ORAL
  Filled 2018-10-16: qty 1

## 2018-10-16 MED ORDER — SUCRALFATE 1 G PO TABS: 1.0000 g | ORAL_TABLET | Freq: Four times a day (QID) | ORAL | 0 refills | Status: DC | PRN

## 2018-10-16 MED ORDER — DICYCLOMINE HCL 10 MG PO CAPS
10.0000 mg | ORAL_CAPSULE | Freq: Once | ORAL | Status: AC
  Administered 2018-10-16: 10 mg via ORAL
  Filled 2018-10-16: qty 1

## 2018-10-16 NOTE — Discharge Instructions (Addendum)
You were evaluated in the Emergency Department and after careful evaluation, we did not find any emergent condition requiring admission or further testing in the hospital.  Your symptoms today seem to be due to possible fatty liver disease.  It may also be related to your hiatal hernia and acid reflux.  Please use the medications provided as needed for your symptoms and follow-up with the GI specialist.  Please return to the Emergency Department if you experience any worsening of your condition.  We encourage you to follow up with a primary care provider.  Thank you for allowing [place] to be a part of your care.

## 2018-10-16 NOTE — ED Triage Notes (Signed)
Pt reports ruq pain intermittently for years.  Reports pain worse since yesterday.  Reports vomited x 2 yesterday  and had diarrhea this morning.

## 2018-10-16 NOTE — ED Notes (Signed)
EDP at bedside updating patient and family. 

## 2018-10-16 NOTE — ED Provider Notes (Signed)
[facility] Emergency Department Provider Note MRN:  [id number]  Arrival date & time: 10/16/18     Chief Complaint   Abdominal Pain   History of Present Illness   Samuel Blackwell is a 28 y.o. year-old male with a history of SVT, GERD, hiatal hernia presenting to the ED with chief complaint of abdominal pain.  Pain is located in the right upper quadrant, has been present for 1 to 2 years, acutely worsening yesterday after eating a cheeseburger for lunch.  Acutely worsened pain has not gone away, still in pain this morning.  Pain is 5 out of 10, constant, sharp.  Associated with 2 episodes of nonbloody nonbilious emesis yesterday evening.  Decreased appetite, disinterest in food today.  Denies fever at home but felt subjectively hot and sweaty yesterday.  Denies chest pain or shortness of breath, no lower abdominal pain.  Review of Systems  A complete 10 system review of systems was obtained and all systems are negative except as noted in the HPI and PMH.   Patient's Health History    Past Medical History:  Diagnosis Date  . Anxiety   . Floaters   . GERD (gastroesophageal reflux disease)   . Hiatal hernia   . Hypertension   . Migraines   . Sinus tachycardia    r/t anxiety  . SVT (supraventricular tachycardia) (HCC)     Past Surgical History:  Procedure Laterality Date  . ABLATION OF DYSRHYTHMIC FOCUS  06/28/2015   SVT  . ELECTROPHYSIOLOGIC STUDY N/A 06/28/2015   Procedure: SVT Ablation;  Surgeon: Evans Lance, MD;  Location: [place] [facility];  Service: Cardiovascular;  Laterality: N/A;  . ESOPHAGOGASTRODUODENOSCOPY (EGD) WITH PROPOFOL N/A 01/22/2016   Dr. Gala Romney: negative Barrett's, small hiatal hernia  . MALONEY DILATION N/A 01/22/2016   Procedure: Venia Minks DILATION;  Surgeon: Daneil Dolin, MD;  [facility];  Service: Endoscopy;  Laterality: N/A;    Family History  Problem Relation Age of Onset  . COPD Mother   . Depression Mother   . Anxiety  disorder Mother   . Hypertension Father   . Depression Father   . Anxiety disorder Father   . Lung cancer Maternal Grandfather   . Heart attack Paternal Grandmother   . Heart attack Paternal Grandfather   . Autism Brother   . Autism Brother   . Diabetes Other   . Cancer Other   . Heart failure Other   . Gallbladder disease Other        multiple family members  . Anxiety disorder Cousin   . Drug abuse Cousin   . Colon cancer Neg Hx     Social History   Socioeconomic History  . Marital status: Single    Spouse name: Not on file  . Number of children: Not on file  . Years of education: Not on file  . Highest education level: Not on file  Occupational History  . Occupation: [employer]  Social Needs  . Financial resource strain: Not on file  . Food insecurity:    Worry: Not on file    Inability: Not on file  . Transportation needs:    Medical: Not on file    Non-medical: Not on file  Tobacco Use  . Smoking status: Former Smoker    Packs/day: 0.25    Years: 6.00    Pack years: 1.50    Types: Cigarettes    Start date: 12/01/2007    Last attempt to  quit: 03/23/2014    Years since quitting: 4.5  . Smokeless tobacco: Former Systems developer    Types: Snuff    Quit date: 09/19/2014  Substance and Sexual Activity  . Alcohol use: Not on file    Comment: occ  . Drug use: No    Comment: 04-30-16 per pt no  . Sexual activity: Yes    Partners: Female  Lifestyle  . Physical activity:    Days per week: Not on file    Minutes per session: Not on file  . Stress: Not on file  Relationships  . Social connections:    Talks on phone: Not on file    Gets together: Not on file    Attends religious service: Not on file    Active member of club or organization: Not on file    Attends meetings of clubs or organizations: Not on file    Relationship status: Not on file  . Intimate partner violence:    Fear of current or ex partner: Not on file    Emotionally abused: Not on file    Physically  abused: Not on file    Forced sexual activity: Not on file  Other Topics Concern  . Not on file  Social History Narrative  . Not on file     Physical Exam  Vital Signs and Nursing Notes reviewed Vitals:   10/16/18 0805 10/16/18 1113  BP: 132/87 (!) 122/56  Pulse: 80 71  Resp: 18 16  SpO2: 97% 98%    CONSTITUTIONAL: Well-appearing, NAD NEURO:  Alert and oriented x 3, no focal deficits EYES:  eyes equal and reactive ENT/NECK:  no LAD, no JVD CARDIO: Regular rate, well-perfused, normal S1 and S2 PULM:  CTAB no wheezing or rhonchi GI/GU:  normal bowel sounds, non-distended, mild tenderness palpation to the right upper quadrant MSK/SPINE:  No gross deformities, no edema SKIN:  no rash, atraumatic PSYCH:  Appropriate speech and behavior  Diagnostic and Interventional Summary    Labs Reviewed  COMPREHENSIVE METABOLIC PANEL - Abnormal; Notable for the following components:      Result Value   Glucose, Bld 106 (*)    Total Bilirubin 2.7 (*)    All other components within normal limits  CBC  LIPASE, BLOOD    CT ABDOMEN PELVIS W CONTRAST  Final Result    US Abdomen Limited RUQ  Final Result      Medications  iopamidol (ISOVUE-300) 61 % injection 100 mL (100 mLs Intravenous Contrast Given 10/16/18 1043)  ondansetron (ZOFRAN-ODT) disintegrating tablet 4 mg (4 mg Oral Given 10/16/18 1129)  dicyclomine (BENTYL) capsule 10 mg (10 mg Oral Given 10/16/18 1129)     Procedures Critical Care  ED Course and Medical Decision Making  I have reviewed the triage vital signs and the nursing notes.  Pertinent labs & imaging results that were available during my care of the patient were reviewed by me and considered in my medical decision making (see below for details).  Question of biliary colic versus cholecystitis in this 28 year old male with history of chronic unexplained right upper quadrant pain but much worse today.  Had CT scan, ultrasound, HIDA scan over a year ago.  Will  evaluate today with labs, ultrasound.  Possibly MSK in etiology, states that pain is worse when taking deep breaths.  However patient is PERC negative.  Mildly elevated bilirubin today.  Ultrasound reveals question of hepatic steatosis, followed up with CT per radiology recommendations.  CT unremarkable.  Favoring mild fatty  liver disease, question also pain related to hiatal hernia and acid reflux.  Given contact information for GI for follow-up, no indication for further testing or admission at this time.  After the discussed management above, the patient was determined to be safe for discharge.  The patient was in agreement with this plan and all questions regarding their care were answered.  ED return precautions were discussed and the patient will return to the ED with any significant worsening of condition.  Barth Kirks. [place]onia Small, [place] [email or web address]  Final Clinical Impressions(s) / ED Diagnoses     ICD-10-CM   1. RUQ pain R10.11 US Abdomen Limited RUQ    US Abdomen Limited RUQ    ED Discharge Orders         Ordered    diphenhydrAMINE (BENADRYL) 25 MG tablet  Every 6 hours     10/16/18 1157    ondansetron (ZOFRAN ODT) 4 MG disintegrating tablet  Every 8 hours PRN     10/16/18 1157    sucralfate (CARAFATE) 1 g tablet  4 times daily PRN     10/16/18 1157             Maudie Flakes, MD 10/16/18 1203

## 2018-10-21 ENCOUNTER — Ambulatory Visit (HOSPITAL_COMMUNITY)
Admission: RE | Admit: 2018-10-21 | Discharge: 2018-10-21 | Disposition: A | Discharge status: Home / Self Care | Payer: 59 | Source: Ambulatory Visit | Attending: Nurse Practitioner | Admitting: Nurse Practitioner

## 2018-10-21 ENCOUNTER — Encounter: Payer: Self-pay | Admitting: *Deleted

## 2018-10-21 ENCOUNTER — Ambulatory Visit: Payer: 59 | Admitting: Nurse Practitioner

## 2018-10-21 ENCOUNTER — Encounter: Payer: Self-pay | Admitting: Nurse Practitioner

## 2018-10-21 ENCOUNTER — Telehealth: Payer: Self-pay | Admitting: *Deleted

## 2018-10-21 ENCOUNTER — Encounter

## 2018-10-21 VITALS — BP 117/66 | HR 72 | Temp 98.2°F | Ht 68.0 in | Wt 277.2 lb

## 2018-10-21 DIAGNOSIS — R112 Nausea with vomiting, unspecified: Secondary | ICD-10-CM

## 2018-10-21 DIAGNOSIS — R1011 Right upper quadrant pain: Secondary | ICD-10-CM

## 2018-10-21 DIAGNOSIS — K219 Gastro-esophageal reflux disease without esophagitis: Secondary | ICD-10-CM

## 2018-10-21 DIAGNOSIS — K5900 Constipation, unspecified: Secondary | ICD-10-CM | POA: Insufficient documentation

## 2018-10-21 NOTE — Assessment & Plan Note (Signed)
Previous significant GERD symptoms currently on Dexilant and symptoms are well managed on this.  Denies any breakthrough symptoms unless he misses his medication.  Recommend he continue his medication and follow-up in 3 months.

## 2018-10-21 NOTE — Patient Instructions (Signed)
Your health issues we discussed today were:   Right upper quadrant pain and nausea with vomiting: 1. We will check an abdominal x-ray for any significant constipation 2. We will check a HIDA scan to evaluate your gallbladder function 3. Continue your current medications  Heartburn (GERD): 1. Continue the medication (Dexilant) that is kept your symptoms under control 2. Call for any worsening symptoms  Drainage from your bellybutton, possible infection: 1. Follow-up with your primary care to determine if antibiotics are needed  Overall I recommend:  1. Return for follow-up in 3 months 2. Further recommendations will be made after your results have come back 3. Call us if you have any questions or concerns  At [facility] we value your feedback. You may receive a survey about your visit today. Please share your experience as we strive to create trusting relationships with our patients to provide genuine, compassionate, quality care.  We appreciate your understanding and patience as we review any laboratory studies, imaging, and other diagnostic tests that are ordered as we care for you. Our office policy is 5 business days for review of these results, and any emergent or urgent results are addressed in a timely manner for your best interest. If you do not hear from our office in 1 week, please contact us.   We also encourage the use of MyChart, which contains your medical information for your review as well. If you are not enrolled in this feature, an access code is on this after visit summary for your convenience. Thank you for allowing [place] to be involved in your care.  It was great to see you today!  I hope you have a great day!!

## 2018-10-21 NOTE — Progress Notes (Signed)
Referring Provider: Lanelle Bal, PA-C Primary Care Physician:  Lanelle Bal, PA-C Primary GI:  Dr. Gala Romney  Chief Complaint  Patient presents with  . Abdominal Pain    ruq  . Emesis    went to ER 1/24    HPI:   Samuel Blackwell is a 28 y.o. male who presents for abdominal pain.  The patient was last seen in our office 04/23/2016 for GERD.  Noted history of GERD, abdominal pain, normal abdominal ultrasound.  EGD without Barrett's, small hiatal hernia.  His last visit was a follow-up after EGD.  Taking Dexilant and doing well.  No GI concerns at that time.  Occasional reflux but overall much improved.  Recommended continue Dexilant, follow-up in 2 years.  The patient was evaluated by the emergency department on 10/16/2022 right upper quadrant abdominal pain.  He noted this is been present for 1 to 2 years with acute worsening the day prior after eating a cheeseburger.  Pain noted to be constant, sharp, 5 out of 10.  Associated with 2 episodes of nonbloody, nonbilious emesis the day prior.  Decreased appetite and disinterest in food noted that day.  Overall question of biliary colic versus cholecystitis.  CT scan, ultrasound, HIDA scan over a year prior was normal.  Query musculoskeletal etiology.  Ultrasound reveals question of hepatic steatosis, no gallstones or wall thickening, common bile duct normal at 5.6 mm; recommended follow-up with CT by radiology.  CT was unremarkable other than stable scattered mesenteric lymph nodes and mild hazy interstitial changes consistent with mesenteric-itis or panniculitis with no change since prior study.  Favoring mixed fatty liver disease or pain related to hiatal hernia and acid reflux.  Mildly elevated bilirubin at 2.7, other LFTs normal.  He messaged our office yesterday, a day after calling to request an appointment, and noticed last night that his bellybutton is discharging the yellowish/bloody fluid which smells bad and the majority of his abdominal pain  is behind his bellybutton.  Today he states he's doing ok overall. He had a cheeseburger recently which caused nausea, cold sweat, vomiting, RUQ pain flare-up; awoke later in the night and vomited again. Previously has eaten cheeseburger almost daily without issue. Pain is persistent today. No further N/V. Has "bad reflux", Takes Dexilant which controls his symptoms, will have a flare of symptoms if he misses a dose. Yesterday after cleaning out his bellybutton after a shower he noted a yellowish, bloody drainage with a foul odor. Has done this before, last time was a couple years ago. Having pain behind his bellybutton as well. Last time this happened PCP treated with antibiotics. Denies hematochezia, melena, fever, chills. Denies chest pain, dyspnea, dizziness, lightheadedness, syncope, near syncope. Denies any other upper or lower GI symptoms.  Past Medical History:  Diagnosis Date  . Anxiety   . Floaters   . GERD (gastroesophageal reflux disease)   . Hiatal hernia   . Hypertension   . Migraines   . Sinus tachycardia    r/t anxiety  . SVT (supraventricular tachycardia) (HCC)     Past Surgical History:  Procedure Laterality Date  . ABLATION OF DYSRHYTHMIC FOCUS  06/28/2015   SVT  . ELECTROPHYSIOLOGIC STUDY N/A 06/28/2015   Procedure: SVT Ablation;  Surgeon: Evans Lance, MD;  Location: [place] [facility];  Service: Cardiovascular;  Laterality: N/A;  . ESOPHAGOGASTRODUODENOSCOPY (EGD) WITH PROPOFOL N/A 01/22/2016   Dr. Gala Romney: negative Barrett's, small hiatal hernia  . MALONEY DILATION N/A 01/22/2016   Procedure:  MALONEY DILATION;  Surgeon: Daneil Dolin, MD;  [facility];  Service: Endoscopy;  Laterality: N/A;    Current Outpatient Medications  Medication Sig Dispense Refill  . ALPRAZolam (XANAX) 1 MG tablet Take 1 tablet (1 mg total) by mouth 3 (three) times daily as needed for anxiety. 90 tablet 2  . DEXILANT 60 MG capsule Take 1 capsule (60 mg total) by mouth daily. 90  capsule 3  . FLUoxetine (PROZAC) 20 MG capsule Take 1 capsule (20 mg total) by mouth daily. 30 capsule 2  . metoprolol (LOPRESSOR) 50 MG tablet Take 1 tablet (50 mg total) by mouth 2 (two) times daily. 301 tablet 0  . ondansetron (ZOFRAN ODT) 4 MG disintegrating tablet Take 1 tablet (4 mg total) by mouth every 8 (eight) hours as needed for nausea or vomiting. 20 tablet 0  . sucralfate (CARAFATE) 1 g tablet Take 1 tablet (1 g total) by mouth 4 (four) times daily as needed. 30 tablet 0   No current facility-administered medications for this visit.     Allergies as of 10/21/2018 - Review Complete 10/21/2018  Allergen Reaction Noted  . Amoxicillin Shortness Of Breath 12/30/2016  . Penicillins Anaphylaxis 07/27/2017    Family History  Problem Relation Age of Onset  . COPD Mother   . Depression Mother   . Anxiety disorder Mother   . Hypertension Father   . Depression Father   . Anxiety disorder Father   . Lung cancer Maternal Grandfather   . Heart attack Paternal Grandmother   . Heart attack Paternal Grandfather   . Autism Brother   . Autism Brother   . Diabetes Other   . Cancer Other   . Heart failure Other   . Gallbladder disease Other        multiple family members  . Anxiety disorder Cousin   . Drug abuse Cousin   . Colon cancer Neg Hx     Social History   Socioeconomic History  . Marital status: Single    Spouse name: Not on file  . Number of children: Not on file  . Years of education: Not on file  . Highest education level: Not on file  Occupational History  . Occupation: [employer]  Social Needs  . Financial resource strain: Not on file  . Food insecurity:    Worry: Not on file    Inability: Not on file  . Transportation needs:    Medical: Not on file    Non-medical: Not on file  Tobacco Use  . Smoking status: Former Smoker    Packs/day: 0.25    Years: 6.00    Pack years: 1.50    Types: Cigarettes    Start date: 12/01/2007    Last attempt to quit:  03/23/2014    Years since quitting: 4.5  . Smokeless tobacco: Former Systems developer    Types: Snuff    Quit date: 09/19/2014  Substance and Sexual Activity  . Alcohol use: Yes    Alcohol/week: 0.0 standard drinks    Comment: occ  . Drug use: No  . Sexual activity: Yes    Partners: Female  Lifestyle  . Physical activity:    Days per week: Not on file    Minutes per session: Not on file  . Stress: Not on file  Relationships  . Social connections:    Talks on phone: Not on file    Gets together: Not on file    Attends religious service: Not on file  Active member of club or organization: Not on file    Attends meetings of clubs or organizations: Not on file    Relationship status: Not on file  Other Topics Concern  . Not on file  Social History Narrative  . Not on file    Review of Systems: General: Negative for anorexia, weight loss, fever, chills, fatigue, weakness. ENT: Negative for hoarseness, difficulty swallowing. CV: Negative for chest pain, angina, palpitations, peripheral edema.  Respiratory: Negative for dyspnea at rest, cough, sputum, wheezing.  GI: See history of present illness. Endo: Negative for unusual weight change.  Heme: Negative for bruising or bleeding. Allergy: Negative for rash or hives.   Physical Exam: BP 117/66   Pulse 72   Temp 98.2 F (36.8 C) (Oral)   Ht 5\' 8"  (1.727 m)   Wt 277 lb 3.2 oz (125.7 kg)   BMI 42.15 kg/m  General:   Alert and oriented. Pleasant and cooperative. Well-nourished and well-developed.  Eyes:  Without icterus, sclera clear and conjunctiva pink.  Ears:  Normal auditory acuity. Cardiovascular:  S1, S2 present without murmurs appreciated. Extremities without clubbing or edema. Respiratory:  Clear to auscultation bilaterally. No wheezes, rales, or rhonchi. No distress.  Gastrointestinal:  +BS, soft, and non-distended. Moderate, acute right-sided TTP radiating into the RUQ. No HSM noted. No guarding or rebound. No masses  appreciated.  Rectal:  Deferred  Musculoskalatal:  Symmetrical without gross deformities. Skin:  Umbilicus area with no active drainage, some minimal dried blood deep inside umbilicus. No obvious nodule/mass in the periumbilical area. Neurologic:  Alert and oriented x4;  grossly normal neurologically. Psych:  Alert and cooperative. Normal mood and affect. Heme/Lymph/Immune: No excessive bruising noted.    10/21/2018 9:56 AM   Disclaimer: This note was dictated with voice recognition software. Similar sounding words can inadvertently be transcribed and may not be corrected upon review.

## 2018-10-21 NOTE — Progress Notes (Signed)
ON RECALL  °

## 2018-10-21 NOTE — Assessment & Plan Note (Signed)
Persistent 2-year history of right upper quadrant pain.  This is been chronic and ongoing.  He did have an acute flare of his pain after eating a cheeseburger recently which also triggered nausea and vomiting.  He has previously eaten these without issue.  He did have a HIDA scan about 2 to 3 years ago which was normal.  Abdominal CT and ultrasound essentially normal related to biliary system.  He does have fatty liver and we discussed dietary changes and exercise to help with this.  At this point I will recheck a HIDA scan for decreased gallbladder function.  Query chronic biliary colic as a possible explanation.  Based on results we may refer him to a surgeon to discuss possible options, now that other possible etiologies have been addressed (GERD, constipation).  Follow-up in 3 months.

## 2018-10-21 NOTE — Assessment & Plan Note (Signed)
The patient describes some intermittent constipation but not typically severe.  Recommended over-the-counter stool softener and MiraLAX to help prevent constipation.  I will check an abdominal x-ray two-view for stool burden.  Follow-up in 3 months.

## 2018-10-21 NOTE — Telephone Encounter (Signed)
PA for HIDA was initiated via [facility] website. Case# [id number]. Pending review. Notes sent

## 2018-10-23 NOTE — Telephone Encounter (Signed)
PA was authorized. Auth# [id number] Dates 10/23/2018-12/07/2018.

## 2018-10-28 ENCOUNTER — Encounter (HOSPITAL_COMMUNITY)
Admission: RE | Admit: 2018-10-28 | Discharge: 2018-10-28 | Disposition: A | Discharge status: Home / Self Care | Payer: 59 | Source: Ambulatory Visit | Attending: Nurse Practitioner | Admitting: Nurse Practitioner

## 2018-10-28 ENCOUNTER — Encounter (HOSPITAL_COMMUNITY): Payer: Self-pay

## 2018-10-28 DIAGNOSIS — R1011 Right upper quadrant pain: Secondary | ICD-10-CM | POA: Diagnosis present

## 2018-10-28 DIAGNOSIS — K219 Gastro-esophageal reflux disease without esophagitis: Secondary | ICD-10-CM

## 2018-10-28 DIAGNOSIS — K5900 Constipation, unspecified: Secondary | ICD-10-CM | POA: Diagnosis present

## 2018-10-28 DIAGNOSIS — R112 Nausea with vomiting, unspecified: Secondary | ICD-10-CM | POA: Diagnosis not present

## 2018-10-28 MED ORDER — TECHNETIUM TC 99M MEBROFENIN IV KIT
5.0000 | PACK | Freq: Once | INTRAVENOUS | Status: AC | PRN
  Administered 2018-10-28: 5.4 via INTRAVENOUS

## 2018-11-11 ENCOUNTER — Ambulatory Visit: Payer: Self-pay | Admitting: Gastroenterology

## 2018-12-29 DIAGNOSIS — F411 Generalized anxiety disorder: Secondary | ICD-10-CM | POA: Diagnosis not present

## 2018-12-29 DIAGNOSIS — I10 Essential (primary) hypertension: Secondary | ICD-10-CM | POA: Diagnosis not present

## 2018-12-29 DIAGNOSIS — I471 Supraventricular tachycardia: Secondary | ICD-10-CM | POA: Diagnosis not present

## 2018-12-29 DIAGNOSIS — K219 Gastro-esophageal reflux disease without esophagitis: Secondary | ICD-10-CM | POA: Diagnosis not present

## 2019-01-20 ENCOUNTER — Ambulatory Visit: Payer: 59 | Admitting: Nurse Practitioner

## 2019-01-20 ENCOUNTER — Other Ambulatory Visit: Payer: Self-pay

## 2019-01-20 NOTE — Progress Notes (Signed)
Patient cancelled or no show.

## 2019-01-26 ENCOUNTER — Other Ambulatory Visit: Payer: Self-pay

## 2019-01-26 ENCOUNTER — Encounter: Payer: Self-pay | Admitting: Nurse Practitioner

## 2019-01-26 ENCOUNTER — Ambulatory Visit (INDEPENDENT_AMBULATORY_CARE_PROVIDER_SITE_OTHER): Payer: 59 | Admitting: Nurse Practitioner

## 2019-01-26 DIAGNOSIS — K219 Gastro-esophageal reflux disease without esophagitis: Secondary | ICD-10-CM

## 2019-01-26 DIAGNOSIS — K5900 Constipation, unspecified: Secondary | ICD-10-CM | POA: Diagnosis not present

## 2019-01-26 DIAGNOSIS — R1011 Right upper quadrant pain: Secondary | ICD-10-CM | POA: Diagnosis not present

## 2019-01-26 NOTE — Assessment & Plan Note (Signed)
Abdominal pain has resolved at this time.  Previous abdominal x-ray showed constipation.  HIDA scan normal.  Given his normal imaging and resolution of his symptoms we will just continue to monitor at this time.  Could have had self-limiting etiology.  No red flag or warning signs or symptoms necessitating endoscopic evaluation.  If he has recurrent symptoms we can work-up further.  Otherwise continue current medications and follow-up in 6 months.

## 2019-01-26 NOTE — Assessment & Plan Note (Signed)
GERD doing better.  Continues on Dexilant without reflux symptoms or nausea/vomiting.  At this point recommend he continue his PPI.  Call for any worsening symptoms otherwise follow-up in 6 months.

## 2019-01-26 NOTE — Patient Instructions (Signed)
Your health issues we discussed today were:   GERD (reflux/heartburn): 1. Continue taking Dexilant daily 2. Notify us if you have any worsening or severe symptoms  Abdominal pain: 1. Your gallbladder function test was normal 2. Your abdominal x-ray showed a moderate amount of stool in your colon, suggesting constipation as a possible cause 3. Call us if you have any recurrent or severe abdominal pain  Constipation: 1. Given your x-ray results I feel constipation was likely contributing to your abdominal pain 2. I am glad this seems to be improved at this time.  You can still use MiraLAX 17 g (1 capful) 1-2 times a day, as needed for constipation 3. Call us if you have any severe worsening symptoms  Overall I recommend:  1. Continue your other medications 2. Call us if you have any questions or concerns 3. Return for follow-up in 6 months.   Because of recent events of COVID-19 ("Coronavirus"), follow CDC recommendations:  1. Wash your hand frequently 2. Avoid touching your face 3. Stay away from people who are sick 4. If you have symptoms such as fever, cough, shortness of breath then call your healthcare provider for further guidance 5. If you are sick, STAY AT HOME unless otherwise directed by your healthcare provider. 6. Follow directions from state and national officials regarding staying safe   At [facility] we value your feedback. You may receive a survey about your visit today. Please share your experience as we strive to create trusting relationships with our patients to provide genuine, compassionate, quality care.  We appreciate your understanding and patience as we review any laboratory studies, imaging, and other diagnostic tests that are ordered as we care for you. Our office policy is 5 business days for review of these results, and any emergent or urgent results are addressed in a timely manner for your best interest. If you do not hear from our  office in 1 week, please contact us.   We also encourage the use of MyChart, which contains your medical information for your review as well. If you are not enrolled in this feature, an access code is on this after visit summary for your convenience. Thank you for allowing [place] to be involved in your care.  It was great to see you today!  I hope you have a great day!!

## 2019-01-26 NOTE — Assessment & Plan Note (Signed)
Constipation seems resolved at this time.  Has a bowel movement every other day which is not hard with no straining.  He has not needed MiraLAX and sometime.  Recommend he continue to drink adequate water, plenty of fiber.  Use MiraLAX as needed for any constipation.  Call for any worsening symptoms.

## 2019-01-26 NOTE — Progress Notes (Signed)
Referring Provider: Lanelle Bal, PA-C Primary Care Physician:  Lanelle Bal, PA-C Primary GI:  Dr. Gala Romney  NOTE: Service was provided via telemedicine and was requested by the patient due to COVID-19 pandemic.  Method of visit: Doxy.Me  Patient Location: Work  Psychologist, clinical  Reason for Phone Visit: Follow-up  The patient was consented to phone follow-up via telephone encounter including billing of the encounter (yes/no): Yes  Persons present on the phone encounter, with roles: None  Total time (minutes) spent on medical discussion: 18 minutes  Chief Complaint  Patient presents with   Gastroesophageal Reflux    HPI:   Samuel Blackwell is a 28 y.o. male who presents for virtual visit regarding: Follow-up on nausea and vomiting as well as GERD.  Previously attempted follow-up office visit 01/20/2019 was initially scheduled but the patient canceled her was a no-show.  Previous office visit dated 10/21/2018 for GERD, constipation, nausea, vomiting, right upper quadrant pain.  Noted history of GERD, abdominal pain.  EGD without Barrett's.  CT scan, ultrasound, HIDA scan related to right upper quadrant pain on file and normal.  At that time query musculoskeletal etiology.  All findings of hepatic steatosis, favoring fatty liver disease and felt pain related to hiatal hernia or GERD.  His last visit he had nausea, cold sweat, vomiting, right upper quadrant pain flare after eating a cheeseburger and vomited again later that evening.  This is never been a problem before.  Has "bad reflux" although Dexilant does control his symptoms.  He also noted that he has been cleaning out his bellybutton shower noticed a yellowish, bloody drainage with a foul odor that was also tender.  Previously this occurred and was treated with antibiotics.  No other GI complaints.  Recommended abdominal x-ray constipation, HIDA scan, continue Dexilant, follow-up with primary care related to possible  cellulitis, follow-up in 3 months.  X-ray completed 10/21/2018 which found moderate stool in the colon otherwise normal bowel gas pattern.  Recommended continued MiraLAX as previously recommended after office visit and call in 1 to 2 weeks if no improvement.  HIDA scan completed 10/28/2018 which found to be normal although noted abdominal pain with oral Ensure consumption.  Overall felt symptoms due to constipation.  Today he states he's doing ok overall. GERD doing well on Dexilant with no breakthrough. Constipation is about the same. Having a bowel movement once every other still, no straining or hard stools. Hasn't needed MiraLAX in some time. Saw PCP to evaluate his previous drainage in his bellybutton; still having intermittent drainage but improved. Denies abdominal pain, hematochezia, N/V, melena, fever, chills, unintentional weight loss. Denies URI and flu-like symptoms. Denies chest pain, dyspnea, dizziness, lightheadedness, syncope, near syncope. Denies any other upper or lower GI symptoms.  Past Medical History:  Diagnosis Date   Anxiety    Floaters    GERD (gastroesophageal reflux disease)    Hiatal hernia    Hypertension    Migraines    Sinus tachycardia    r/t anxiety   SVT (supraventricular tachycardia) ([place])     Past Surgical History:  Procedure Laterality Date   ABLATION OF DYSRHYTHMIC FOCUS  06/28/2015   SVT   ELECTROPHYSIOLOGIC STUDY N/A 06/28/2015   Procedure: SVT Ablation;  Surgeon: Samuel Lance, MD;  Location: [place] [facility];  Service: Cardiovascular;  Laterality: N/A;   ESOPHAGOGASTRODUODENOSCOPY (EGD) WITH PROPOFOL N/A 01/22/2016   Dr. Gala Romney: negative Barrett's, small hiatal hernia   MALONEY DILATION N/A 01/22/2016  Procedure: MALONEY DILATION;  Surgeon: Daneil Dolin, MD;  [facility];  Service: Endoscopy;  Laterality: N/A;    Current Outpatient Medications  Medication Sig Dispense Refill   ALPRAZolam (XANAX) 1 MG tablet Take 1  tablet (1 mg total) by mouth 3 (three) times daily as needed for anxiety. 90 tablet 2   DEXILANT 60 MG capsule Take 1 capsule (60 mg total) by mouth daily. 90 capsule 3   FLUoxetine (PROZAC) 20 MG capsule Take 1 capsule (20 mg total) by mouth daily. 30 capsule 2   metoprolol (LOPRESSOR) 50 MG tablet Take 1 tablet (50 mg total) by mouth 2 (two) times daily. 301 tablet 0   ondansetron (ZOFRAN ODT) 4 MG disintegrating tablet Take 1 tablet (4 mg total) by mouth every 8 (eight) hours as needed for nausea or vomiting. 20 tablet 0   sucralfate (CARAFATE) 1 g tablet Take 1 tablet (1 g total) by mouth 4 (four) times daily as needed. 30 tablet 0   No current facility-administered medications for this visit.     Allergies as of 01/26/2019 - Review Complete 01/26/2019  Allergen Reaction Noted   Amoxicillin Shortness Of Breath 12/30/2016   Penicillins Anaphylaxis 07/27/2017    Family History  Problem Relation Age of Onset   COPD Mother    Depression Mother    Anxiety disorder Mother    Hypertension Father    Depression Father    Anxiety disorder Father    Lung cancer Maternal Grandfather    Heart attack Paternal Grandmother    Heart attack Paternal Grandfather    Autism Brother    Autism Brother    Diabetes Other    Cancer Other    Heart failure Other    Gallbladder disease Other        multiple family members   Anxiety disorder Cousin    Drug abuse Cousin    Colon cancer Neg Hx     Social History   Socioeconomic History   Marital status: Single    Spouse name: Not on file   Number of children: Not on file   Years of education: Not on file   Highest education level: Not on file  Occupational History   Occupation: [employer]  Scientist, product/process development strain: Not on file   Food insecurity:    Worry: Not on file    Inability: Not on file   Transportation needs:    Medical: Not on file    Non-medical: Not on file  Tobacco Use    Smoking status: Current Every Day Smoker    Packs/day: 0.50    Years: 6.00    Pack years: 3.00    Types: Cigarettes    Start date: 12/01/2007    Last attempt to quit: 03/23/2014    Years since quitting: 4.8   Smokeless tobacco: Current User    Types: Snuff  Substance and Sexual Activity   Alcohol use: Yes    Alcohol/week: 0.0 standard drinks    Comment: occ   Drug use: No   Sexual activity: Yes    Partners: Female  Lifestyle   Physical activity:    Days per week: Not on file    Minutes per session: Not on file   Stress: Not on file  Relationships   Social connections:    Talks on phone: Not on file    Gets together: Not on file    Attends religious service: Not on file  Active member of club or organization: Not on file    Attends meetings of clubs or organizations: Not on file    Relationship status: Not on file  Other Topics Concern   Not on file  Social History Narrative   Not on file    Review of Systems: General: Negative for anorexia, weight loss, fever, chills, fatigue, weakness. ENT: Negative for hoarseness, difficulty swallowing. CV: Negative for chest pain, angina, palpitations, peripheral edema.  Respiratory: Negative for dyspnea at rest, cough, sputum, wheezing.  GI: See history of present illness. Endo: Negative for unusual weight change.  Heme: Negative for bruising or bleeding. Allergy: Negative for rash or hives.  Physical Exam: Note: limited exam due to virtual visit General:   Obese male. Alert and oriented. Pleasant and cooperative. Well-nourished and well-developed.  Head:  Normocephalic and atraumatic. Eyes:  Without icterus, sclera clear and conjunctiva pink.  Ears:  Normal auditory acuity. Skin:  Intact without facial significant lesions or rashes. Neurologic:  Alert and oriented x4;  grossly normal neurologically. Psych:  Alert and cooperative. Normal mood and affect. Heme/Lymph/Immune: No excessive bruising noted.

## 2019-01-27 ENCOUNTER — Encounter: Payer: Self-pay | Admitting: Internal Medicine

## 2019-01-27 NOTE — Progress Notes (Signed)
cc'ed to pcp °

## 2019-04-13 ENCOUNTER — Other Ambulatory Visit: Payer: Self-pay

## 2019-04-13 ENCOUNTER — Emergency Department (HOSPITAL_COMMUNITY): Payer: 59

## 2019-04-13 ENCOUNTER — Encounter (HOSPITAL_COMMUNITY): Payer: Self-pay | Admitting: *Deleted

## 2019-04-13 ENCOUNTER — Emergency Department (HOSPITAL_COMMUNITY)
Admission: EM | Admit: 2019-04-13 | Discharge: 2019-04-14 | Disposition: A | Discharge status: Home / Self Care | Payer: 59 | Attending: Emergency Medicine | Admitting: Emergency Medicine

## 2019-04-13 DIAGNOSIS — Z79899 Other long term (current) drug therapy: Secondary | ICD-10-CM | POA: Insufficient documentation

## 2019-04-13 DIAGNOSIS — R079 Chest pain, unspecified: Secondary | ICD-10-CM | POA: Diagnosis present

## 2019-04-13 DIAGNOSIS — E860 Dehydration: Secondary | ICD-10-CM | POA: Diagnosis not present

## 2019-04-13 DIAGNOSIS — I10 Essential (primary) hypertension: Secondary | ICD-10-CM | POA: Diagnosis not present

## 2019-04-13 DIAGNOSIS — Z87891 Personal history of nicotine dependence: Secondary | ICD-10-CM | POA: Diagnosis not present

## 2019-04-13 DIAGNOSIS — R0789 Other chest pain: Secondary | ICD-10-CM | POA: Insufficient documentation

## 2019-04-13 DIAGNOSIS — T675XXA Heat exhaustion, unspecified, initial encounter: Secondary | ICD-10-CM | POA: Insufficient documentation

## 2019-04-13 DIAGNOSIS — I471 Supraventricular tachycardia: Secondary | ICD-10-CM | POA: Insufficient documentation

## 2019-04-13 LAB — BASIC METABOLIC PANEL
Anion gap: 9 (ref 5–15)
BUN: 15 mg/dL (ref 6–20)
CO2: 22 mmol/L (ref 22–32)
Calcium: 8.9 mg/dL (ref 8.9–10.3)
Chloride: 108 mmol/L (ref 98–111)
Creatinine, Ser: 0.88 mg/dL (ref 0.61–1.24)
GFR calc Af Amer: >60 mL/min (ref >60)
GFR calc non Af Amer: >60 mL/min (ref >60)
Glucose, Bld: 124 mg/dL — ABNORMAL HIGH (ref 70–99)
Potassium: 3.7 mmol/L (ref 3.5–5.1)
Sodium: 139 mmol/L (ref 135–145)

## 2019-04-13 LAB — CBC
HCT: 44.2 % (ref 39.0–52.0)
Hemoglobin: 14.8 g/dL (ref 13.0–17.0)
MCH: 28.4 pg (ref 26.0–34.0)
MCHC: 33.5 g/dL (ref 30.0–36.0)
MCV: 84.7 fL (ref 80.0–100.0)
Platelets: 265 10*3/uL (ref 150–400)
RBC: 5.22 MIL/uL (ref 4.22–5.81)
RDW: 12.6 % (ref 11.5–15.5)
WBC: 11 10*3/uL — ABNORMAL HIGH (ref 4.0–10.5)
nRBC: 0 % (ref 0.0–0.2)

## 2019-04-13 LAB — TROPONIN I (HIGH SENSITIVITY): Troponin I (High Sensitivity): 2 ng/L (ref <18)

## 2019-04-13 MED ORDER — SODIUM CHLORIDE 0.9 % IV BOLUS
1000.0000 mL | Freq: Once | INTRAVENOUS | Status: AC
  Administered 2019-04-13: 1000 mL via INTRAVENOUS

## 2019-04-13 NOTE — ED Provider Notes (Signed)
[facility] EMERGENCY DEPARTMENT Provider Note   CSN: [id number] Arrival date & time: 04/13/19  2138  Time seen 11:15 PM  History   Chief Complaint Chief Complaint  Patient presents with  . Chest Pain    HPI Samuel Blackwell is a 28 y.o. male.     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  He relates the heat index today was 106 and it has been over 100 for the past several days.  He states about 9:51 AM this morning he started having a left mid chest discomfort that would come and go and last a few minutes at a time.  He described it as dull and aching.  He states when his chest is having pain it is painful to touch that area, he states when he laid down after work it seemed to get worse.  Nothing else helped his pain.  He denies shortness of breath.  He states he had been nauseated even before the pain started.  He denies any vomiting, feeling dizzy or lightheaded.  He states he has been sweating a lot and he drinks a lot of water however when he urinates his urine is very dark.  He states he quit smoking 2 months ago but he does chew tobacco.  He states his father's family on both sides has significant heart disease.  Patient states he has had chest pains before with GERD however he states his Dexilant has controlled his symptoms significantly.  PCP Lanelle Bal, PA-C   Past Medical History:  Diagnosis Date  . Anxiety   . Floaters   . GERD (gastroesophageal reflux disease)   . Hiatal hernia   . Hypertension   . Migraines   . Sinus tachycardia    r/t anxiety  . SVT (supraventricular tachycardia) [facility])     Patient Active Problem List   Diagnosis Date Noted  . Constipation 10/21/2018  . Dysphagia   . Mucosal abnormality of esophagus   . Hiatal hernia   . Abdominal pain 12/27/2015  . Globus sensation 12/27/2015  . Chest pain 06/14/2015  . GERD (gastroesophageal reflux disease) 06/14/2015  . Anxiety 05/24/2015  .  SVT (supraventricular tachycardia) ([place]) 09/29/2014    Past Surgical History:  Procedure Laterality Date  . ABLATION OF DYSRHYTHMIC FOCUS  06/28/2015   SVT  . ELECTROPHYSIOLOGIC STUDY N/A 06/28/2015   Procedure: SVT Ablation;  Surgeon: Evans Lance, MD;  Location: [place] [facility];  Service: Cardiovascular;  Laterality: N/A;  . ESOPHAGOGASTRODUODENOSCOPY (EGD) WITH PROPOFOL N/A 01/22/2016   Dr. Gala Romney: negative Barrett's, small hiatal hernia  . MALONEY DILATION N/A 01/22/2016   Procedure: Venia Minks DILATION;  Surgeon: Daneil Dolin, MD;  [facility];  Service: Endoscopy;  Laterality: N/A;        Home Medications    Prior to Admission medications   Medication Sig Start Date End Date Taking? Authorizing Provider  acetaminophen (TYLENOL 8 HOUR ARTHRITIS PAIN) 650 MG CR tablet Take 650 mg by mouth every evening.   Yes [provider]  ALPRAZolam [name]) 1 MG tablet Take 1 tablet (1 mg total) by mouth 3 (three) times daily as needed for anxiety. 05/31/16  Yes Cloria Spring, MD  DEXILANT 60 MG capsule Take 1 capsule (60 mg total) by mouth daily. 08/07/16  Yes Annitta Needs, NP  FLUoxetine (PROZAC) 20 MG capsule Take 1 capsule (20 mg total) by mouth daily. 05/31/16  Yes  Cloria Spring, MD  metoprolol (LOPRESSOR) 50 MG tablet Take 1 tablet (50 mg total) by mouth 2 (two) times daily. 12/30/16  Yes Dorie Rank, MD    Family History Family History  Problem Relation Age of Onset  . COPD Mother   . Depression Mother   . Anxiety disorder Mother   . Hypertension Father   . Depression Father   . Anxiety disorder Father   . Lung cancer Maternal Grandfather   . Heart attack Paternal Grandmother   . Heart attack Paternal Grandfather   . Autism Brother   . Autism Brother   . Diabetes Other   . Cancer Other   . Heart failure Other   . Gallbladder disease Other        multiple family members  . Anxiety disorder Cousin   . Drug abuse Cousin   . Colon cancer Neg Hx      Social History Social History   Tobacco Use  . Smoking status: Former Smoker    Packs/day: 0.50    Years: 6.00    Pack years: 3.00    Types: Cigarettes    Start date: 12/01/2007    Quit date: 03/23/2014    Years since quitting: 5.0  . Smokeless tobacco: Current User    Types: Snuff, Chew  Substance Use Topics  . Alcohol use: Not Currently    Alcohol/week: 0.0 standard drinks  . Drug use: No  employed   Allergies   Amoxicillin and Penicillins   Review of Systems Review of Systems  All other systems reviewed and are negative.    Physical Exam Updated Vital Signs BP 121/60   Pulse 67   Temp 98.7 F (37.1 C) (Oral)   Resp (!) 23   Ht 5\' 8"  (1.727 m)   Wt 127 kg   SpO2 99%   BMI 42.57 kg/m   Physical Exam Vitals signs and nursing note reviewed.  Constitutional:      Appearance: He is well-developed. He is obese.  HENT:     Head: Normocephalic and atraumatic.     Right Ear: External ear normal.     Left Ear: External ear normal.     Nose: Nose normal.     Mouth/Throat:     Mouth: Mucous membranes are dry.  Eyes:     Extraocular Movements: Extraocular movements intact.     Conjunctiva/sclera: Conjunctivae normal.     Pupils: Pupils are equal, round, and reactive to light.  Neck:     Musculoskeletal: Normal range of motion and neck supple.  Cardiovascular:     Rate and Rhythm: Normal rate and regular rhythm.     Pulses: Normal pulses.     Heart sounds: Normal heart sounds.  Pulmonary:     Effort: Pulmonary effort is normal. No respiratory distress.     Breath sounds: Normal breath sounds.  Abdominal:     General: Abdomen is flat. Bowel sounds are normal.     Palpations: Abdomen is soft.  Musculoskeletal: Normal range of motion.        General: No swelling.  Skin:    General: Skin is warm and dry.     Capillary Refill: Capillary refill takes less than 2 seconds.  Neurological:     General: No focal deficit present.     Mental Status: He is alert and  oriented to person, place, and time.     Cranial Nerves: No cranial nerve deficit.  Psychiatric:  Mood and Affect: Mood normal.        Behavior: Behavior normal.        Thought Content: Thought content normal.      ED Treatments / Results  Labs (all labs ordered are listed, but only abnormal results are displayed) Results for orders placed or performed during the hospital encounter of 73/53/29  Basic metabolic panel  Result Value Ref Range   Sodium 139 135 - 145 mmol/L   Potassium 3.7 3.5 - 5.1 mmol/L   Chloride 108 98 - 111 mmol/L   CO2 22 22 - 32 mmol/L   Glucose, Bld 124 (H) 70 - 99 mg/dL   BUN 15 6 - 20 mg/dL   Creatinine, Ser 0.88 0.61 - 1.24 mg/dL   Calcium 8.9 8.9 - 10.3 mg/dL   GFR calc non Af Amer >60 >60 mL/min   GFR calc Af Amer >60 >60 mL/min   Anion gap 9 5 - 15  CBC  Result Value Ref Range   WBC 11.0 (H) 4.0 - 10.5 K/uL   RBC 5.22 4.22 - 5.81 MIL/uL   Hemoglobin 14.8 13.0 - 17.0 g/dL   HCT 44.2 39.0 - 52.0 %   MCV 84.7 80.0 - 100.0 fL   MCH 28.4 26.0 - 34.0 pg   MCHC 33.5 30.0 - 36.0 g/dL   RDW 12.6 11.5 - 15.5 %   Platelets 265 150 - 400 K/uL   nRBC 0.0 0.0 - 0.2 %  CK  Result Value Ref Range   Total CK 204 49 - 397 U/L  Troponin I (High Sensitivity)  Result Value Ref Range   Troponin I (High Sensitivity) 2 <18 ng/L  Troponin I (High Sensitivity)  Result Value Ref Range   Troponin I (High Sensitivity) 2 <18 ng/L   Laboratory interpretation all normal except mild hyperglycemia, mild leukocytosis, delta troponin is normal and negative    EKG EKG Interpretation  Date/Time:  Tuesday April 13 2019 21:45:17 EDT Ventricular Rate:  74 PR Interval:  154 QRS Duration: 94 QT Interval:  370 QTC Calculation: 410 R Axis:     Text Interpretation:  Normal sinus rhythm Incomplete right bundle branch block Borderline ECG Confirmed by Nat Christen [phone]) on 04/13/2019 10:26:37 PM   Radiology Dg Chest Port 1 View  Result Date: 04/13/2019 CLINICAL  DATA:  Left-sided chest pain EXAM: PORTABLE CHEST 1 VIEW COMPARISON:  12/30/2016 FINDINGS: The heart size and mediastinal contours are within normal limits. Both lungs are clear. The visualized skeletal structures are unremarkable. IMPRESSION: No active disease. Electronically Signed   By: Inez Catalina M.D.   On: 04/13/2019 23:19    Procedures Procedures (including critical care time)  Medications Ordered in ED Medications  sodium chloride 0.9 % bolus 1,000 mL (0 mLs Intravenous Stopped 04/14/19 0044)  sodium chloride 0.9 % bolus 1,000 mL (1,000 mLs Intravenous New Bag/Given 04/13/19 2350)  sodium chloride 0.9 % bolus 1,000 mL (1,000 mLs Intravenous New Bag/Given 04/14/19 0047)     Initial Impression / Assessment and Plan / ED Course  I have reviewed the triage vital signs and the nursing notes.  Pertinent labs & imaging results that were available during my care of the patient were reviewed by me and considered in my medical decision making (see chart for details).   After talking to the patient and him describing the environment he works in a CK was added to his blood work to check him for rhabdomyolysis, his creatinine function is normal.  He was  given normal saline bolus.  We discussed drinking sports drinks when he works and is sweating a lot.  Recheck at 12:40 AM patient has almost finished 2 L of normal saline.  He states he really does not have the urge to urinate yet.  Patient was given #3 L normal saline.  His CPK has come back and is within normal limits, his second troponin is pending.  Patient second troponin has returned and is normal  Recheck 1:20 AM patient has received 3 L of normal saline.  He has a small amount of dark urine at the bedside.  He states however he feels better.  He is going to be discharged home to drink some Gatorade tonight and in the morning.  He should drink sports drinks while working in the heat.  Final Clinical Impressions(s) / ED Diagnoses   Final  diagnoses:  Atypical chest pain  Dehydration  Heat exhaustion, initial encounter    ED Discharge Orders    None     Plan discharge  Rolland Porter, MD, Barbette Or, MD 04/14/19 [id number]

## 2019-04-13 NOTE — ED Notes (Signed)
ekg done in triage and handed to Dr. Lacinda Axon

## 2019-04-13 NOTE — ED Triage Notes (Signed)
Pt with mid to left cp since this morning, also with left arm pain, jaw pain.  Denies hx of same. Denies sob or diaphoresis.

## 2019-04-14 LAB — TROPONIN I (HIGH SENSITIVITY): Troponin I (High Sensitivity): 2 ng/L (ref <18)

## 2019-04-14 LAB — CK: Total CK: 204 U/L (ref 49–397)

## 2019-04-14 MED ORDER — SODIUM CHLORIDE 0.9 % IV BOLUS
1000.0000 mL | Freq: Once | INTRAVENOUS | Status: AC
  Administered 2019-04-14: 1000 mL via INTRAVENOUS

## 2019-04-14 NOTE — Discharge Instructions (Signed)
Drink more fluids like sports drinks when you are working in the heat and sweating a lot.  Stop on the way home and get a bottle of Gatorade or sports drink to drink tonight and another to drink in the morning   Continue your Dexilant. Recheck if you get chest pain that is constant and lasting more then 30 minutes.

## 2019-07-29 NOTE — Progress Notes (Deleted)
Referring Provider: Lanelle Bal, PA-C Primary Care Physician:  Lanelle Bal, PA-C Primary GI Physician: Dr. Rayne Du chief complaint on file.   HPI:   Samuel Blackwell is a 28 y.o. male presenting today for follow-up with a history of GERD, constipation, and abdominal pain s/p workup most recently in January/February 2020 including with [place], CT, and HIDA all essentially normal. He did have hepatic steatosis noted on [place], LFTs within normal limits, bilirubin slightly elevated at 2.7.  HIDA was normal but patient did experience abdominal pain with the oral Ensure consumption.  Abdominal x-ray also completed in January revealing moderate stool in the colon. EGD in May 2017 with salmon-colored mucosa in the esophagus without Barrett's on pathology s/p dilation, small hiatal hernia, otherwise normal.  He was last seen via telemedicine visit on 01/26/2019 for follow-up of GERD, constipation, and right upper quadrant pain.  GERD symptoms were doing well on Dexilant.  He had not required MiraLAX in some time and bowel movements every other day.  Abdominal pain seemed to have resolved.  It was noted that his office visit in January that he had return of right upper quadrant pain after eating a cheeseburger with associated nausea and vomiting.  Patient was advised to continue Dexilant, continue MiraLAX 1-2 times daily as needed, continue to monitor abdominal pain as it seems to have resolved, and follow-up in 6 months.  Today he states  Past Medical History:  Diagnosis Date  . Anxiety   . Floaters   . GERD (gastroesophageal reflux disease)   . Hiatal hernia   . Hypertension   . Migraines   . Sinus tachycardia    r/t anxiety  . SVT (supraventricular tachycardia) (HCC)     Past Surgical History:  Procedure Laterality Date  . ABLATION OF DYSRHYTHMIC FOCUS  06/28/2015   SVT  . ELECTROPHYSIOLOGIC STUDY N/A 06/28/2015   Procedure: SVT Ablation;  Surgeon: Evans Lance, MD;  Location: [facility];  Service: Cardiovascular;  Laterality: N/A;  . ESOPHAGOGASTRODUODENOSCOPY (EGD) WITH PROPOFOL N/A 01/22/2016   Dr. Gala Romney: negative Barrett's, small hiatal hernia  . MALONEY DILATION N/A 01/22/2016   Procedure: Venia Minks DILATION;  Surgeon: Daneil Dolin, MD;  [facility];  Service: Endoscopy;  Laterality: N/A;    Current Outpatient Medications  Medication Sig Dispense Refill  . acetaminophen (TYLENOL 8 HOUR ARTHRITIS PAIN) 650 MG CR tablet Take 650 mg by mouth every evening.    Marland Kitchen ALPRAZolam (XANAX) 1 MG tablet Take 1 tablet (1 mg total) by mouth 3 (three) times daily as needed for anxiety. 90 tablet 2  . DEXILANT 60 MG capsule Take 1 capsule (60 mg total) by mouth daily. 90 capsule 3  . FLUoxetine (PROZAC) 20 MG capsule Take 1 capsule (20 mg total) by mouth daily. 30 capsule 2  . metoprolol (LOPRESSOR) 50 MG tablet Take 1 tablet (50 mg total) by mouth 2 (two) times daily. 301 tablet 0   No current facility-administered medications for this visit.     Allergies as of 07/30/2019 - Review Complete 04/13/2019  Allergen Reaction Noted  . Amoxicillin Shortness Of Breath 12/30/2016  . Penicillins Anaphylaxis 07/27/2017    Family History  Problem Relation Age of Onset  . COPD Mother   . Depression Mother   . Anxiety disorder Mother   . Hypertension Father   . Depression Father   . Anxiety disorder Father   . Lung cancer Maternal Grandfather   . Heart attack  Paternal Grandmother   . Heart attack Paternal Grandfather   . Autism Brother   . Autism Brother   . Diabetes Other   . Cancer Other   . Heart failure Other   . Gallbladder disease Other        multiple family members  . Anxiety disorder Cousin   . Drug abuse Cousin   . Colon cancer Neg Hx     Social History   Socioeconomic History  . Marital status: Single    Spouse name: Not on file  . Number of children: Not on file  . Years of education: Not on file  . Highest education level: Not on file   Occupational History  . Occupation: [employer]  Social Needs  . Financial resource strain: Not on file  . Food insecurity    Worry: Not on file    Inability: Not on file  . Transportation needs    Medical: Not on file    Non-medical: Not on file  Tobacco Use  . Smoking status: Former Smoker    Packs/day: 0.50    Years: 6.00    Pack years: 3.00    Types: Cigarettes    Start date: 12/01/2007    Quit date: 03/23/2014    Years since quitting: 5.3  . Smokeless tobacco: Current User    Types: Snuff, Chew  Substance and Sexual Activity  . Alcohol use: Not Currently    Alcohol/week: 0.0 standard drinks  . Drug use: No  . Sexual activity: Yes    Partners: Female  Lifestyle  . Physical activity    Days per week: Not on file    Minutes per session: Not on file  . Stress: Not on file  Relationships  . Social Herbalist on phone: Not on file    Gets together: Not on file    Attends religious service: Not on file    Active member of club or organization: Not on file    Attends meetings of clubs or organizations: Not on file    Relationship status: Not on file  Other Topics Concern  . Not on file  Social History Narrative  . Not on file    Review of Systems: Gen: Denies fever, chills, anorexia. Denies fatigue, weakness, weight loss.  CV: Denies chest pain, palpitations, syncope, peripheral edema, and claudication. Resp: Denies dyspnea at rest, cough, wheezing, coughing up blood, and pleurisy. GI: Denies vomiting blood, jaundice, and fecal incontinence.   Denies dysphagia or odynophagia. Derm: Denies rash, itching, dry skin Psych: Denies depression, anxiety, memory loss, confusion. No homicidal or suicidal ideation.  Heme: Denies bruising, bleeding, and enlarged lymph nodes.  Physical Exam: There were no vitals taken for this visit. General:   Alert and oriented. No distress noted. Pleasant and cooperative.  Head:  Normocephalic and atraumatic. Eyes:  Conjuctiva  clear without scleral icterus. Mouth:  Oral mucosa pink and moist. Good dentition. No lesions. Heart:  S1, S2 present without murmurs appreciated. Lungs:  Clear to auscultation bilaterally. No wheezes, rales, or rhonchi. No distress.  Abdomen:  +BS, soft, non-tender and non-distended. No rebound or guarding. No HSM or masses noted. Msk:  Symmetrical without gross deformities. Normal posture. Extremities:  Without edema. Neurologic:  Alert and  oriented x4 Psych:  Alert and cooperative. Normal mood and affect.

## 2019-07-30 ENCOUNTER — Telehealth: Payer: Self-pay | Admitting: Internal Medicine

## 2019-07-30 ENCOUNTER — Ambulatory Visit: Payer: 59 | Admitting: Nurse Practitioner

## 2019-07-30 ENCOUNTER — Ambulatory Visit: Payer: 59 | Admitting: Gastroenterology

## 2019-07-30 NOTE — Telephone Encounter (Signed)
Noted  

## 2019-07-30 NOTE — Telephone Encounter (Signed)
Patient was a no show and letter sent  °

## 2019-11-02 DIAGNOSIS — I471 Supraventricular tachycardia: Secondary | ICD-10-CM | POA: Diagnosis not present

## 2019-11-02 DIAGNOSIS — K219 Gastro-esophageal reflux disease without esophagitis: Secondary | ICD-10-CM | POA: Diagnosis not present

## 2019-11-02 DIAGNOSIS — I10 Essential (primary) hypertension: Secondary | ICD-10-CM | POA: Diagnosis not present

## 2019-11-02 DIAGNOSIS — F411 Generalized anxiety disorder: Secondary | ICD-10-CM | POA: Diagnosis not present

## 2019-11-20 DIAGNOSIS — Z6841 Body Mass Index (BMI) 40.0 and over, adult: Secondary | ICD-10-CM | POA: Diagnosis not present

## 2019-11-20 DIAGNOSIS — R05 Cough: Secondary | ICD-10-CM | POA: Diagnosis not present

## 2019-11-20 DIAGNOSIS — Z20828 Contact with and (suspected) exposure to other viral communicable diseases: Secondary | ICD-10-CM | POA: Diagnosis not present

## 2019-12-07 DIAGNOSIS — Z20828 Contact with and (suspected) exposure to other viral communicable diseases: Secondary | ICD-10-CM | POA: Diagnosis not present

## 2019-12-07 DIAGNOSIS — J069 Acute upper respiratory infection, unspecified: Secondary | ICD-10-CM | POA: Diagnosis not present

## 2020-01-10 ENCOUNTER — Telehealth: Payer: Self-pay | Admitting: General Practice

## 2020-01-10 NOTE — Telephone Encounter (Signed)
I spoke with the patient and he wanted some samples of Dexilant.  I made the patient aware that he no-showed for an appt back in 07/2019.  I left two boxes of Dexialnt at the front desk and made him aware that he needed to keep his appt with [facility] next week.

## 2020-01-18 ENCOUNTER — Ambulatory Visit: Payer: Self-pay | Admitting: Internal Medicine

## 2020-01-18 ENCOUNTER — Encounter: Payer: Self-pay | Admitting: Internal Medicine

## 2020-01-18 ENCOUNTER — Telehealth: Payer: Self-pay | Admitting: Internal Medicine

## 2020-01-18 NOTE — Telephone Encounter (Signed)
Patient was a no show and letter sent  °

## 2020-03-02 DIAGNOSIS — M79602 Pain in left arm: Secondary | ICD-10-CM | POA: Diagnosis not present

## 2020-03-02 DIAGNOSIS — E162 Hypoglycemia, unspecified: Secondary | ICD-10-CM | POA: Diagnosis not present

## 2020-04-03 ENCOUNTER — Other Ambulatory Visit: Payer: Self-pay

## 2020-04-03 ENCOUNTER — Emergency Department (HOSPITAL_COMMUNITY)
Admission: EM | Admit: 2020-04-03 | Discharge: 2020-04-03 | Disposition: A | Discharge status: Home / Self Care | Payer: BC Managed Care – PPO | Attending: Emergency Medicine | Admitting: Emergency Medicine

## 2020-04-03 ENCOUNTER — Encounter (HOSPITAL_COMMUNITY): Payer: Self-pay | Admitting: Emergency Medicine

## 2020-04-03 DIAGNOSIS — Z5321 Procedure and treatment not carried out due to patient leaving prior to being seen by health care provider: Secondary | ICD-10-CM | POA: Insufficient documentation

## 2020-04-03 DIAGNOSIS — R42 Dizziness and giddiness: Secondary | ICD-10-CM | POA: Diagnosis not present

## 2020-04-03 NOTE — ED Triage Notes (Signed)
Pt c/o n/v/d/dizziness x 2 days; denies fever

## 2021-03-14 ENCOUNTER — Encounter: Payer: Self-pay | Admitting: Internal Medicine

## 2021-03-20 ENCOUNTER — Encounter (HOSPITAL_COMMUNITY): Payer: Self-pay

## 2021-03-20 ENCOUNTER — Emergency Department (HOSPITAL_COMMUNITY)
Admission: EM | Admit: 2021-03-20 | Discharge: 2021-03-20 | Disposition: A | Discharge status: Home / Self Care | Payer: BC Managed Care – PPO | Attending: Emergency Medicine | Admitting: Emergency Medicine

## 2021-03-20 ENCOUNTER — Other Ambulatory Visit: Payer: Self-pay

## 2021-03-20 ENCOUNTER — Emergency Department (HOSPITAL_COMMUNITY): Payer: BC Managed Care – PPO

## 2021-03-20 DIAGNOSIS — Z7982 Long term (current) use of aspirin: Secondary | ICD-10-CM | POA: Diagnosis not present

## 2021-03-20 DIAGNOSIS — R1013 Epigastric pain: Secondary | ICD-10-CM | POA: Insufficient documentation

## 2021-03-20 DIAGNOSIS — Z87891 Personal history of nicotine dependence: Secondary | ICD-10-CM | POA: Insufficient documentation

## 2021-03-20 DIAGNOSIS — R0789 Other chest pain: Secondary | ICD-10-CM | POA: Diagnosis not present

## 2021-03-20 DIAGNOSIS — Z79899 Other long term (current) drug therapy: Secondary | ICD-10-CM | POA: Insufficient documentation

## 2021-03-20 DIAGNOSIS — I10 Essential (primary) hypertension: Secondary | ICD-10-CM | POA: Insufficient documentation

## 2021-03-20 LAB — CBC WITH DIFFERENTIAL/PLATELET
Abs Immature Granulocytes: 0.04 10*3/uL (ref 0.00–0.07)
Basophils Absolute: 0.1 10*3/uL (ref 0.0–0.1)
Basophils Relative: 1 %
Eosinophils Absolute: 0.1 10*3/uL (ref 0.0–0.5)
Eosinophils Relative: 1 %
HCT: 47.7 % (ref 39.0–52.0)
Hemoglobin: 15.8 g/dL (ref 13.0–17.0)
Immature Granulocytes: 0 %
Lymphocytes Relative: 37 %
Lymphs Abs: 3.7 10*3/uL (ref 0.7–4.0)
MCH: 27.7 pg (ref 26.0–34.0)
MCHC: 33.1 g/dL (ref 30.0–36.0)
MCV: 83.5 fL (ref 80.0–100.0)
Monocytes Absolute: 0.9 10*3/uL (ref 0.1–1.0)
Monocytes Relative: 9 %
Neutro Abs: 5.1 10*3/uL (ref 1.7–7.7)
Neutrophils Relative %: 52 %
Platelets: 286 10*3/uL (ref 150–400)
RBC: 5.71 MIL/uL (ref 4.22–5.81)
RDW: 13.8 % (ref 11.5–15.5)
WBC: 9.8 10*3/uL (ref 4.0–10.5)
nRBC: 0 % (ref 0.0–0.2)

## 2021-03-20 LAB — COMPREHENSIVE METABOLIC PANEL
ALT: 55 U/L — ABNORMAL HIGH (ref 0–44)
AST: 27 U/L (ref 15–41)
Albumin: 4.6 g/dL (ref 3.5–5.0)
Alkaline Phosphatase: 89 U/L (ref 38–126)
Anion gap: 7 (ref 5–15)
BUN: 9 mg/dL (ref 6–20)
CO2: 23 mmol/L (ref 22–32)
Calcium: 9.5 mg/dL (ref 8.9–10.3)
Chloride: 109 mmol/L (ref 98–111)
Creatinine, Ser: 0.82 mg/dL (ref 0.61–1.24)
GFR, Estimated: >60 mL/min (ref >60)
Glucose, Bld: 97 mg/dL (ref 70–99)
Potassium: 4.2 mmol/L (ref 3.5–5.1)
Sodium: 139 mmol/L (ref 135–145)
Total Bilirubin: 1.1 mg/dL (ref 0.3–1.2)
Total Protein: 7.3 g/dL (ref 6.5–8.1)

## 2021-03-20 LAB — TROPONIN I (HIGH SENSITIVITY)
Troponin I (High Sensitivity): 2 ng/L (ref <18)
Troponin I (High Sensitivity): 2 ng/L (ref <18)
Troponin I (High Sensitivity): 3 ng/L (ref <18)

## 2021-03-20 LAB — LIPASE, BLOOD: Lipase: 30 U/L (ref 11–51)

## 2021-03-20 NOTE — ED Provider Notes (Signed)
[facility] EMERGENCY DEPARTMENT Provider Note   CSN: [id number] Arrival date & time: 03/20/21  1851     History Chief Complaint  Patient presents with   Chest Pain    Samuel Blackwell is a 30 y.o. male.  Patient states she has some chest discomfort.  He points to the epigastric area and then going into his back.  His pain is improved now  The history is provided by the patient and medical records. No language interpreter was used.  Chest Pain Pain location:  Epigastric Pain quality: aching   Pain radiates to:  Precordial region Pain severity:  Moderate Onset quality:  Sudden Duration: 3 days. Timing:  Constant Progression:  Waxing and waning Chronicity:  New Context: not breathing   Relieved by:  Nothing Worsened by:  Nothing Associated symptoms: no abdominal pain, no back pain, no cough, no fatigue, no fever, no headache, no palpitations, no shortness of breath and no vomiting       Past Medical History:  Diagnosis Date   Anxiety    Floaters    GERD (gastroesophageal reflux disease)    Hiatal hernia    Hypertension    Migraines    Sinus tachycardia    r/t anxiety   SVT (supraventricular tachycardia) ([place])     Patient Active Problem List   Diagnosis Date Noted   Constipation 10/21/2018   Dysphagia    Mucosal abnormality of esophagus    Hiatal hernia    Abdominal pain 12/27/2015   Globus sensation 12/27/2015   Chest pain 06/14/2015   GERD (gastroesophageal reflux disease) 06/14/2015   Anxiety 05/24/2015   SVT (supraventricular tachycardia) ([place]) 09/29/2014    Past Surgical History:  Procedure Laterality Date   ABLATION OF DYSRHYTHMIC FOCUS  06/28/2015   SVT   ELECTROPHYSIOLOGIC STUDY N/A 06/28/2015   Procedure: SVT Ablation;  Surgeon: Evans Lance, MD;  Location: [place] [facility];  Service: Cardiovascular;  Laterality: N/A;   ESOPHAGOGASTRODUODENOSCOPY (EGD) WITH PROPOFOL N/A 01/22/2016   Dr. Gala Romney: negative Barrett's, small hiatal hernia    MALONEY DILATION N/A 01/22/2016   Procedure: Venia Minks DILATION;  Surgeon: Daneil Dolin, MD;  [facility];  Service: Endoscopy;  Laterality: N/A;       Family History  Problem Relation Age of Onset   COPD Mother    Depression Mother    Anxiety disorder Mother    Hypertension Father    Depression Father    Anxiety disorder Father    Lung cancer Maternal Grandfather    Heart attack Paternal Grandmother    Heart attack Paternal Grandfather    Autism Brother    Autism Brother    Diabetes Other    Cancer Other    Heart failure Other    Gallbladder disease Other        multiple family members   Anxiety disorder Cousin    Drug abuse Cousin    Colon cancer Neg Hx     Social History   Tobacco Use   Smoking status: Former    Packs/day: 0.50    Years: 6.00    Pack years: 3.00    Types: Cigarettes    Start date: 12/01/2007    Quit date: 03/23/2014    Years since quitting: 6.9   Smokeless tobacco: Current    Types: Snuff, Chew  Vaping Use   Vaping Use: Every day  Substance Use Topics   Alcohol use: Not Currently    Alcohol/week: 0.0 standard drinks  Drug use: No    Home Medications Prior to Admission medications   Medication Sig Start Date End Date Taking? Authorizing Provider  ALPRAZolam [name]) 1 MG tablet Take 1 tablet (1 mg total) by mouth 3 (three) times daily as needed for anxiety. 05/31/16  Yes Cloria Spring, MD  aspirin EC 81 MG tablet Take 81 mg by mouth daily. Swallow whole.   Yes [provider]  FLUoxetine (PROZAC) 20 MG capsule Take 1 capsule (20 mg total) by mouth daily. 05/31/16  Yes Cloria Spring, MD  metoprolol (LOPRESSOR) 50 MG tablet Take 1 tablet (50 mg total) by mouth 2 (two) times daily. 12/30/16  Yes Dorie Rank, MD  omeprazole (PRILOSEC) 20 MG capsule Take 20 mg by mouth daily.   Yes [provider]  acetaminophen (TYLENOL) 650 MG CR tablet Take 650 mg by mouth every evening. Patient not taking: Reported on 03/20/2021     [provider]  DEXILANT 60 MG capsule Take 1 capsule (60 mg total) by mouth daily. Patient not taking: Reported on 03/20/2021 08/07/16   Annitta Needs, NP    Allergies    Amoxicillin and Penicillins  Review of Systems   Review of Systems  Constitutional:  Negative for appetite change, chills, fatigue and fever.  HENT:  Negative for congestion, ear discharge, ear pain, sinus pressure and sore throat.   Eyes:  Negative for pain, discharge and visual disturbance.  Respiratory:  Negative for cough and shortness of breath.   Cardiovascular:  Positive for chest pain. Negative for palpitations.  Gastrointestinal:  Negative for abdominal pain, diarrhea and vomiting.  Genitourinary:  Negative for dysuria, frequency and hematuria.  Musculoskeletal:  Negative for arthralgias and back pain.  Skin:  Negative for color change and rash.  Neurological:  Negative for seizures, syncope and headaches.  Psychiatric/Behavioral:  Negative for hallucinations.   All other systems reviewed and are negative.  Physical Exam Updated Vital Signs BP 135/79   Pulse (!) 55   Temp 98.4 F (36.9 C) (Oral)   Resp 17   Ht 5\' 10"  (1.778 m)   Wt 122.9 kg   SpO2 99%   BMI 38.88 kg/m   Physical Exam Constitutional:      Appearance: Normal appearance. He is well-developed.  HENT:     Head: Normocephalic.     Nose: Nose normal.  Eyes:     General: No scleral icterus.    Conjunctiva/sclera: Conjunctivae normal.  Neck:     Thyroid: No thyromegaly.  Cardiovascular:     Rate and Rhythm: Normal rate and regular rhythm.     Heart sounds: No murmur heard.   No friction rub. No gallop.  Pulmonary:     Breath sounds: No stridor. No wheezing or rales.  Chest:     Chest wall: No tenderness.  Abdominal:     General: There is no distension.     Tenderness: There is no abdominal tenderness. There is no rebound.     Comments: Tender epigastric  Musculoskeletal:        General: Normal range of motion.      Cervical back: Neck supple.  Lymphadenopathy:     Cervical: No cervical adenopathy.  Skin:    Findings: No erythema or rash.  Neurological:     Mental Status: He is alert and oriented to person, place, and time.     Motor: No abnormal muscle tone.     Coordination: Coordination normal.  Psychiatric:  Behavior: Behavior normal.    ED Results / Procedures / Treatments   Labs (all labs ordered are listed, but only abnormal results are displayed) Labs Reviewed  COMPREHENSIVE METABOLIC PANEL - Abnormal; Notable for the following components:      Result Value   ALT 55 (*)    All other components within normal limits  LIPASE, BLOOD  CBC WITH DIFFERENTIAL/PLATELET  TROPONIN I (HIGH SENSITIVITY)  TROPONIN I (HIGH SENSITIVITY)  TROPONIN I (HIGH SENSITIVITY)    EKG EKG Interpretation  Date/Time:  Tuesday March 20 2021 19:48:28 EDT Ventricular Rate:  69 PR Interval:  148 QRS Duration: 96 QT Interval:  366 QTC Calculation: 392 R Axis:   -2 Text Interpretation: Normal sinus rhythm Minimal voltage criteria for LVH, may be normal variant ( R in aVL ) Inferior infarct , age undetermined Abnormal ECG Confirmed by Milton Ferguson [phone]) on 03/20/2021 9:35:40 PM  Radiology DG Chest 2 View  Result Date: 03/20/2021 CLINICAL DATA:  Chest pain. EXAM: CHEST - 2 VIEW COMPARISON:  April 13, 2019 FINDINGS: There is no evidence of acute infiltrate, pleural effusion or pneumothorax. The heart size and mediastinal contours are within normal limits. The visualized skeletal structures are unremarkable. IMPRESSION: No active cardiopulmonary disease. Electronically Signed   By: Virgina Norfolk M.D.   On: 03/20/2021 20:30    Procedures Procedures   Medications Ordered in ED Medications - No data to display  ED Course  I have reviewed the triage vital signs and the nursing notes.  Pertinent labs & imaging results that were available during my care of the patient were reviewed by me and  considered in my medical decision making (see chart for details).    MDM Rules/Calculators/A&P                          Patient with atypical chest pain.  Most likely GERD related.  He will increase his omeprazole to 40 mg a day Final Clinical Impression(s) / ED Diagnoses Final diagnoses:  Atypical chest pain    Rx / DC Orders ED Discharge Orders     None        Milton Ferguson, MD 03/23/21 1107

## 2021-03-20 NOTE — Discharge Instructions (Addendum)
Increase your omeprazole so you are taking 2 pills a day and follow-up with your PCP next week

## 2021-03-20 NOTE — ED Triage Notes (Signed)
Pov from home with cc of chest pain at started around 12pm. Mid chest that radiates to the back. Hx of SVT and ablation.

## 2021-04-24 ENCOUNTER — Ambulatory Visit: Payer: BC Managed Care – PPO | Admitting: Internal Medicine

## 2021-04-24 ENCOUNTER — Encounter: Payer: Self-pay | Admitting: Internal Medicine

## 2022-02-20 ENCOUNTER — Encounter (HOSPITAL_COMMUNITY): Payer: Self-pay

## 2022-02-20 ENCOUNTER — Emergency Department (HOSPITAL_COMMUNITY)
Admission: EM | Admit: 2022-02-20 | Discharge: 2022-02-20 | Disposition: A | Discharge status: Home / Self Care | Payer: BC Managed Care – PPO | Attending: Emergency Medicine | Admitting: Emergency Medicine

## 2022-02-20 ENCOUNTER — Other Ambulatory Visit: Payer: Self-pay

## 2022-02-20 DIAGNOSIS — G43909 Migraine, unspecified, not intractable, without status migrainosus: Secondary | ICD-10-CM | POA: Diagnosis present

## 2022-02-20 DIAGNOSIS — Z87891 Personal history of nicotine dependence: Secondary | ICD-10-CM | POA: Diagnosis not present

## 2022-02-20 DIAGNOSIS — I10 Essential (primary) hypertension: Secondary | ICD-10-CM | POA: Diagnosis not present

## 2022-02-20 DIAGNOSIS — G43009 Migraine without aura, not intractable, without status migrainosus: Secondary | ICD-10-CM

## 2022-02-20 MED ORDER — KETOROLAC TROMETHAMINE 15 MG/ML IJ SOLN
15.0000 mg | Freq: Once | INTRAMUSCULAR | Status: AC
  Administered 2022-02-20: 15 mg via INTRAVENOUS
  Filled 2022-02-20: qty 1

## 2022-02-20 MED ORDER — DIPHENHYDRAMINE HCL 50 MG/ML IJ SOLN
25.0000 mg | Freq: Once | INTRAMUSCULAR | Status: AC
  Administered 2022-02-20: 25 mg via INTRAVENOUS
  Filled 2022-02-20: qty 1

## 2022-02-20 MED ORDER — SODIUM CHLORIDE 0.9 % IV BOLUS
1000.0000 mL | Freq: Once | INTRAVENOUS | Status: AC
  Administered 2022-02-20: 1000 mL via INTRAVENOUS

## 2022-02-20 MED ORDER — PROCHLORPERAZINE EDISYLATE 10 MG/2ML IJ SOLN
10.0000 mg | Freq: Once | INTRAMUSCULAR | Status: AC
  Administered 2022-02-20: 10 mg via INTRAVENOUS
  Filled 2022-02-20: qty 2

## 2022-02-20 NOTE — ED Triage Notes (Addendum)
Pov form home. Cc of migraine x2 days. Hx of the same. Took ibuprofen and goodys without relief.   Also complains of nausea since today.

## 2022-02-20 NOTE — ED Provider Notes (Signed)
[facility] Emergency Department Provider Note MRN:  [id number]  Arrival date & time: 02/20/22     Chief Complaint   Migraine   History of Present Illness   Samuel Blackwell is a 31 y.o. year-old male with a history of migraines presenting to the ED with chief complaint of migraine.  Dull frontal headache yesterday morning that was initially mild, became more severe gradually throughout the day.  Feels very similar to prior migraines.  Associated with 5 episodes of vomiting, worse with bright lights and loud noises.  Denies fever, no numbness or weakness to the arms or legs.  Review of Systems  A thorough review of systems was obtained and all systems are negative except as noted in the HPI and PMH.   Patient's Health History    Past Medical History:  Diagnosis Date   Anxiety    Floaters    GERD (gastroesophageal reflux disease)    Hiatal hernia    Hypertension    Migraines    Sinus tachycardia    r/t anxiety   SVT (supraventricular tachycardia) ([place])     Past Surgical History:  Procedure Laterality Date   ABLATION OF DYSRHYTHMIC FOCUS  06/28/2015   SVT   ELECTROPHYSIOLOGIC STUDY N/A 06/28/2015   Procedure: SVT Ablation;  Surgeon: Evans Lance, MD;  Location: [place] [facility];  Service: Cardiovascular;  Laterality: N/A;   ESOPHAGOGASTRODUODENOSCOPY (EGD) WITH PROPOFOL N/A 01/22/2016   Dr. Gala Romney: negative Barrett's, small hiatal hernia   MALONEY DILATION N/A 01/22/2016   Procedure: Venia Minks DILATION;  Surgeon: Daneil Dolin, MD;  [facility];  Service: Endoscopy;  Laterality: N/A;    Family History  Problem Relation Age of Onset   COPD Mother    Depression Mother    Anxiety disorder Mother    Hypertension Father    Depression Father    Anxiety disorder Father    Lung cancer Maternal Grandfather    Heart attack Paternal Grandmother    Heart attack Paternal Grandfather    Autism Brother    Autism Brother    Diabetes Other     Cancer Other    Heart failure Other    Gallbladder disease Other        multiple family members   Anxiety disorder Cousin    Drug abuse Cousin    Colon cancer Neg Hx     Social History   Socioeconomic History   Marital status: Single    Spouse name: Not on file   Number of children: Not on file   Years of education: Not on file   Highest education level: Not on file  Occupational History   Occupation: [employer]  Tobacco Use   Smoking status: Former    Packs/day: 0.50    Years: 6.00    Pack years: 3.00    Types: Cigarettes    Start date: 12/01/2007    Quit date: 03/23/2014    Years since quitting: 7.9   Smokeless tobacco: Current    Types: Snuff, Chew  Vaping Use   Vaping Use: Every day  Substance and Sexual Activity   Alcohol use: Not Currently    Alcohol/week: 0.0 standard drinks   Drug use: No   Sexual activity: Yes    Partners: Female  Other Topics Concern   Not on file  Social History Narrative   Not on file   Social Determinants of Health   Financial Resource Strain: Not on file  Food Insecurity:  Not on file  Transportation Needs: Not on file  Physical Activity: Not on file  Stress: Not on file  Social Connections: Not on file  Intimate Partner Violence: Not on file     Physical Exam   Vitals:   02/20/22 0630  BP: 137/82  Pulse: 77  Resp: 19  Temp: 97.7 F (36.5 C)  SpO2: 100%    CONSTITUTIONAL: Well-appearing, NAD NEURO/PSYCH:  Alert and oriented x 3, no focal deficits EYES:  eyes equal and reactive ENT/NECK:  no LAD, no JVD CARDIO: Regular rate, well-perfused, normal S1 and S2 PULM:  CTAB no wheezing or rhonchi GI/GU:  non-distended, non-tender MSK/SPINE:  No gross deformities, no edema SKIN:  no rash, atraumatic   *Additional and/or pertinent findings included in MDM below  Diagnostic and Interventional Summary    EKG Interpretation  Date/Time:    Ventricular Rate:    PR Interval:    QRS Duration:   QT Interval:    QTC  Calculation:   R Axis:     Text Interpretation:         Labs Reviewed - No data to display  No orders to display    Medications  ketorolac (TORADOL) 15 MG/ML injection 15 mg (has no administration in time range)  diphenhydrAMINE (BENADRYL) injection 25 mg (has no administration in time range)  prochlorperazine (COMPAZINE) injection 10 mg (has no administration in time range)  sodium chloride 0.9 % bolus 1,000 mL (has no administration in time range)     Procedures  /  Critical Care Procedures  ED Course and Medical Decision Making  Initial Impression and Ddx Seems consistent with migraine, reassuring exam, providing cocktail and will reassess.  Currently without indication for CNS imaging.  Past medical/surgical history that increases complexity of ED encounter: Migraines  Interpretation of Diagnostics Not applicable Patient Reassessment and Ultimate Disposition/Management Awaiting reassessment after meds, signed out to oncoming provider.  Anticipating discharge if feeling better.  Patient management required discussion with the following services or consulting groups:  None  Complexity of Problems Addressed Acute illness or injury that poses threat of life of bodily function  Additional Data Reviewed and Analyzed Further history obtained from: None  Additional Factors Impacting ED Encounter Risk None  Samuel Blackwell. Sedonia Small, MD [place] [email or web address]  Final Clinical Impressions(s) / ED Diagnoses     ICD-10-CM   1. Migraine without aura and without status migrainosus, not intractable  G43.009       ED Discharge Orders     None        Discharge Instructions Discussed with and Provided to Patient:   Discharge Instructions   None      Maudie Flakes, MD 02/20/22 [phone]

## 2022-02-20 NOTE — ED Notes (Addendum)
H/o frequent migraines, but this is the first migraine this year. Has PCP at [facility] in [place]. No neuro MD. No maintenance meds. Use to take topamax, but did not need for awhile. Thought initially this was a sinus HA, but never went away. No relief with ibuprofen, acetaminophen, melatonin, or ASA.

## 2022-02-20 NOTE — ED Provider Notes (Signed)
Care of patient assumed from Dr. Sedonia Small at 7:30 AM.  This patient presents with headache, consistent with prior migraines.  Headache cocktail has been ordered.  Reassess following medical therapy. Physical Exam  BP 128/72   Pulse 77   Temp 97.7 F (36.5 C)   Resp 19   Ht '5\' 10"'$  (1.778 m)   Wt 123 kg   SpO2 99%   BMI 38.91 kg/m   Physical Exam Vitals and nursing note reviewed.  Constitutional:      General: He is not in acute distress.    Appearance: Normal appearance. He is well-developed. He is not ill-appearing, toxic-appearing or diaphoretic.  HENT:     Head: Normocephalic and atraumatic.     Right Ear: External ear normal.     Left Ear: External ear normal.     Nose: Nose normal.  Eyes:     Extraocular Movements: Extraocular movements intact.     Conjunctiva/sclera: Conjunctivae normal.  Cardiovascular:     Rate and Rhythm: Normal rate and regular rhythm.  Pulmonary:     Effort: Pulmonary effort is normal. No respiratory distress.  Abdominal:     General: Abdomen is flat.  Musculoskeletal:        General: No swelling. Normal range of motion.     Cervical back: Normal range of motion and neck supple.  Skin:    General: Skin is warm and dry.     Capillary Refill: Capillary refill takes less than 2 seconds.  Neurological:     General: No focal deficit present.     Mental Status: He is alert and oriented to person, place, and time.     Cranial Nerves: No cranial nerve deficit.     Sensory: No sensory deficit.     Motor: No weakness.     Coordination: Coordination normal.  Psychiatric:        Mood and Affect: Mood normal.        Behavior: Behavior normal.        Thought Content: Thought content normal.        Judgment: Judgment normal.    Procedures  Procedures  ED Course / MDM    Medical Decision Making Risk Prescription drug management.   On assessment, patient sleeping comfortably with normal vital signs.  He is awakened easily and currently endorses  complete resolution of his headache.  Patient was advised to return to the emergency department for any recurrence of concerning symptoms.  He was discharged in good condition.       Godfrey Pick, MD 02/20/22 [phone]

## 2022-02-20 NOTE — Discharge Instructions (Signed)
Continue home medications as prescribed.  Return to the emergency department for any worsening symptoms.

## 2023-10-27 DIAGNOSIS — E669 Obesity, unspecified: Secondary | ICD-10-CM | POA: Diagnosis not present

## 2023-10-27 DIAGNOSIS — Z6835 Body mass index (BMI) 35.0-35.9, adult: Secondary | ICD-10-CM | POA: Diagnosis not present

## 2023-10-27 DIAGNOSIS — I10 Essential (primary) hypertension: Secondary | ICD-10-CM | POA: Insufficient documentation

## 2023-10-27 DIAGNOSIS — R079 Chest pain, unspecified: Secondary | ICD-10-CM | POA: Diagnosis present

## 2023-10-27 DIAGNOSIS — Z7982 Long term (current) use of aspirin: Secondary | ICD-10-CM | POA: Insufficient documentation

## 2023-10-27 DIAGNOSIS — R0789 Other chest pain: Secondary | ICD-10-CM | POA: Diagnosis not present

## 2023-10-27 NOTE — ED Triage Notes (Signed)
Pt c/o chest pain that radiates around to left side and down left arm

## 2023-10-28 ENCOUNTER — Other Ambulatory Visit: Payer: Self-pay

## 2023-10-28 ENCOUNTER — Encounter (HOSPITAL_COMMUNITY): Payer: Self-pay | Admitting: *Deleted

## 2023-10-28 ENCOUNTER — Emergency Department (HOSPITAL_COMMUNITY)
Admission: EM | Admit: 2023-10-28 | Discharge: 2023-10-28 | Disposition: A | Discharge status: Home / Self Care | Payer: BC Managed Care – PPO | Attending: Emergency Medicine | Admitting: Emergency Medicine

## 2023-10-28 ENCOUNTER — Emergency Department (HOSPITAL_COMMUNITY): Payer: BC Managed Care – PPO

## 2023-10-28 DIAGNOSIS — R0789 Other chest pain: Secondary | ICD-10-CM

## 2023-10-28 LAB — BASIC METABOLIC PANEL
Anion gap: 10 (ref 5–15)
BUN: 12 mg/dL (ref 6–20)
CO2: 22 mmol/L (ref 22–32)
Calcium: 9.5 mg/dL (ref 8.9–10.3)
Chloride: 107 mmol/L (ref 98–111)
Creatinine, Ser: 0.96 mg/dL (ref 0.61–1.24)
GFR, Estimated: >60 mL/min (ref >60)
Glucose, Bld: 104 mg/dL — ABNORMAL HIGH (ref 70–99)
Potassium: 4.1 mmol/L (ref 3.5–5.1)
Sodium: 139 mmol/L (ref 135–145)

## 2023-10-28 LAB — CBC
HCT: 45.5 % (ref 39.0–52.0)
Hemoglobin: 16 g/dL (ref 13.0–17.0)
MCH: 30.5 pg (ref 26.0–34.0)
MCHC: 35.2 g/dL (ref 30.0–36.0)
MCV: 86.7 fL (ref 80.0–100.0)
Platelets: 274 10*3/uL (ref 150–400)
RBC: 5.25 MIL/uL (ref 4.22–5.81)
RDW: 12.2 % (ref 11.5–15.5)
WBC: 9 10*3/uL (ref 4.0–10.5)
nRBC: 0 % (ref 0.0–0.2)

## 2023-10-28 LAB — TROPONIN I (HIGH SENSITIVITY): Troponin I (High Sensitivity): 2 ng/L (ref <18)

## 2023-10-28 MED ORDER — KETOROLAC TROMETHAMINE 30 MG/ML IJ SOLN
30.0000 mg | Freq: Once | INTRAMUSCULAR | Status: AC
  Administered 2023-10-28: 30 mg via INTRAMUSCULAR
  Filled 2023-10-28: qty 1

## 2023-10-28 NOTE — ED Provider Notes (Signed)
 [place] EMERGENCY DEPARTMENT AT [facility] Provider Note   CSN: [id number] Arrival date & time: 10/27/23  2348     History  Chief Complaint  Patient presents with   Chest Pain    Samuel Blackwell is a 33 y.o. male.  HPI    This is a 33 year old male who presents with concerns for chest pain.  Patient reports he has had chest pain on and off for the last week.  It migrates to different areas of his chest under his left arm and into his left elbow.  Nothing seems to make it better or worse.  It is not necessarily exertional in nature.  Does not seem to be better or worse with moving or breathing.  Patient denies any fevers or cough.  Patient does report recent anxiety related to some blood work he got from his primary physician.  He reports high triglycerides and high cholesterol.  Does not have any personal history of heart disease but does report everybody on my dad's family has heart disease. Home Medications Prior to Admission medications   Medication Sig Start Date End Date Taking? Authorizing Provider  acetaminophen  (TYLENOL ) 650 MG CR tablet Take 650 mg by mouth every evening. Patient not taking: Reported on 03/20/2021    [provider]  ALPRAZolam  (XANAX ) 1 MG tablet Take 1 tablet (1 mg total) by mouth 3 (three) times daily as needed for anxiety. 05/31/16   Okey Barnie SAUNDERS, MD  aspirin EC 81 MG tablet Take 81 mg by mouth daily. Swallow whole.    [provider]  DEXILANT  60 MG capsule Take 1 capsule (60 mg total) by mouth daily. Patient not taking: Reported on 03/20/2021 08/07/16   Shirlean Therisa ORN, NP  FLUoxetine  (PROZAC ) 20 MG capsule Take 1 capsule (20 mg total) by mouth daily. 05/31/16   Okey Barnie SAUNDERS, MD  metoprolol  (LOPRESSOR ) 50 MG tablet Take 1 tablet (50 mg total) by mouth 2 (two) times daily. 12/30/16   Randol Simmonds, MD  omeprazole (PRILOSEC) 20 MG capsule Take 20 mg by mouth daily.    [provider]      Allergies    Amoxicillin and  Penicillins    Review of Systems   Review of Systems  Constitutional:  Negative for fever.  Respiratory:  Negative for shortness of breath.   Cardiovascular:  Positive for chest pain. Negative for leg swelling.  All other systems reviewed and are negative.   Physical Exam Updated Vital Signs BP 128/86   Pulse 69   Temp 98.3 F (36.8 C)   Resp 18   Ht 1.753 m (5' 9)   Wt 108.9 kg   SpO2 98%   BMI 35.44 kg/m  Physical Exam Vitals and nursing note reviewed.  Constitutional:      Appearance: He is well-developed. He is obese. He is not ill-appearing.  HENT:     Head: Normocephalic and atraumatic.  Eyes:     Pupils: Pupils are equal, round, and reactive to light.  Cardiovascular:     Rate and Rhythm: Normal rate and regular rhythm.     Heart sounds: Normal heart sounds. No murmur heard. Pulmonary:     Effort: Pulmonary effort is normal. No respiratory distress.     Breath sounds: Normal breath sounds. No wheezing.  Abdominal:     General: Bowel sounds are normal.     Palpations: Abdomen is soft.     Tenderness: There is no abdominal tenderness. There is no  rebound.  Musculoskeletal:     Cervical back: Neck supple.  Lymphadenopathy:     Cervical: No cervical adenopathy.  Skin:    General: Skin is warm and dry.  Neurological:     General: No focal deficit present.     Mental Status: He is alert and oriented to person, place, and time.     ED Results / Procedures / Treatments   Labs (all labs ordered are listed, but only abnormal results are displayed) Labs Reviewed  BASIC METABOLIC PANEL - Abnormal; Notable for the following components:      Result Value   Glucose, Bld 104 (*)    All other components within normal limits  CBC  TROPONIN I (HIGH SENSITIVITY)  TROPONIN I (HIGH SENSITIVITY)    EKG EKG Interpretation Date/Time:  Tuesday October 28 2023 00:00:47 EST Ventricular Rate:  70 PR Interval:  160 QRS Duration:  100 QT Interval:  376 QTC  Calculation: 406 R Axis:   -11  Text Interpretation: Normal sinus rhythm Minimal voltage criteria for LVH, may be normal variant ( R in aVL ) Cannot rule out Inferior infarct (cited on or before 20-Mar-2021) Abnormal ECG When compared with ECG of 20-Mar-2021 19:48, No significant change was found Confirmed by Bari Pfeiffer ([id number]) on 10/28/2023 1:53:58 AM  Radiology DG Chest 2 View Result Date: 10/28/2023 CLINICAL DATA:  Chest pain. Persistent chest pain over 1 week. Pain radiates to the right arm. Sometimes shortness of breath. EXAM: CHEST - 2 VIEW COMPARISON:  03/20/2021 FINDINGS: The heart size and mediastinal contours are within normal limits. Both lungs are clear. The visualized skeletal structures are unremarkable. IMPRESSION: No active cardiopulmonary disease. Electronically Signed   By: Elsie Gravely M.D.   On: 10/28/2023 01:51    Procedures Procedures    Medications Ordered in ED Medications  ketorolac  (TORADOL ) 30 MG/ML injection 30 mg (30 mg Intramuscular Given 10/28/23 0220)    ED Course/ Medical Decision Making/ A&P                                 Medical Decision Making Amount and/or Complexity of Data Reviewed Labs: ordered. Radiology: ordered.  Risk Prescription drug management.   This patient presents to the ED for concern of chest pain, this involves an extensive number of treatment options, and is a complaint that carries with it a high risk of complications and morbidity.  I considered the following differential and admission for this acute, potentially life threatening condition.  The differential diagnosis includes ACS, PE, pneumothorax, pneumonia, costochondritis, reflux  MDM:    This is a 33 year old male who presents with concerns for chest discomfort.  Atypical in nature.  He is nontoxic and vital signs are reassuring.  Does have a history of hypertension and hyperlipidemia.  He reports significant anxiety related to his symptoms and recent lab work.   EKG does not show any evidence of acute ischemia or arrhythmia.  Troponin is negative x 1.  Basic lab work is largely reassuring.  Chest x-ray shows no evidence of pneumothorax or pneumonia.  Today his chest pain is fairly atypical in nature.  Do not feel he needs more than 1 troponin for restratification however, given his history and heart score of 2, would have him follow-up with cardiology for further testing and possible coronary CT.  Patient is agreeable plan.  (Labs, imaging, consults)  Labs: I Ordered, and personally interpreted labs.  The pertinent  results include: CBC, BMP, troponin  Imaging Studies ordered: I ordered imaging studies including chest x-ray I independently visualized and interpreted imaging. I agree with the radiologist interpretation  Additional history obtained from chart review.  External records from outside source obtained and reviewed including prior evaluations  Cardiac Monitoring: The patient was maintained on a cardiac monitor.  If on the cardiac monitor, I personally viewed and interpreted the cardiac monitored which showed an underlying rhythm of: Sinus  Reevaluation: After the interventions noted above, I reevaluated the patient and found that they have :stayed the same  Social Determinants of Health:  lives independently  Disposition: Discharge  Co morbidities that complicate the patient evaluation  Past Medical History:  Diagnosis Date   Anxiety    Floaters    GERD (gastroesophageal reflux disease)    Hiatal hernia    Hypertension    Migraines    Sinus tachycardia    r/t anxiety   SVT (supraventricular tachycardia) (HCC)      Medicines Meds ordered this encounter  Medications   ketorolac  (TORADOL ) 30 MG/ML injection 30 mg    I have reviewed the patients home medicines and have made adjustments as needed  Problem List / ED Course: Problem List Items Addressed This Visit   None Visit Diagnoses       Atypical chest pain    -   Primary   Relevant Orders   Ambulatory referral to Cardiology                   Final Clinical Impression(s) / ED Diagnoses Final diagnoses:  Atypical chest pain    Rx / DC Orders ED Discharge Orders          Ordered    Ambulatory referral to Cardiology        10/28/23 0223              Bari Charmaine FALCON, MD 10/28/23 [phone]

## 2023-10-28 NOTE — Discharge Instructions (Signed)
 You were seen today for chest pain.  Your workup today is reassuring.  You were referred to cardiology for definitive evaluation.  Make sure that you are modifying your diet and getting more exercise to address ongoing concerns for high cholesterol and high triglycerides.  If you have any new or worsening symptoms, you should be reevaluated.

## 2024-02-26 ENCOUNTER — Ambulatory Visit: Payer: BC Managed Care – PPO | Attending: Internal Medicine | Admitting: Internal Medicine

## 2024-02-26 ENCOUNTER — Encounter: Payer: Self-pay | Admitting: Internal Medicine

## 2024-02-26 NOTE — Progress Notes (Signed)
 Erroneous encounter - please disregard.
